# Patient Record
Sex: Female | Born: 1955 | Race: White | Hispanic: No | Smoking: Current every day smoker
Health system: Southern US, Community
[De-identification: ages and names within clinical notes are randomized; demographics above are authoritative.]

## PROBLEM LIST (undated history)

## (undated) DIAGNOSIS — C539 Malignant neoplasm of cervix uteri, unspecified: Secondary | ICD-10-CM

## (undated) HISTORY — PX: NO PAST SURGERIES: SHX2092

---

## 1986-05-21 DIAGNOSIS — C539 Malignant neoplasm of cervix uteri, unspecified: Secondary | ICD-10-CM

## 1986-05-21 HISTORY — DX: Malignant neoplasm of cervix uteri, unspecified: C53.9

## 2004-07-20 ENCOUNTER — Ambulatory Visit: Payer: Self-pay | Admitting: Unknown Physician Specialty

## 2005-10-03 ENCOUNTER — Emergency Department: Payer: Self-pay | Admitting: Emergency Medicine

## 2008-02-12 ENCOUNTER — Emergency Department: Payer: Self-pay | Admitting: Emergency Medicine

## 2008-04-17 ENCOUNTER — Emergency Department: Payer: Self-pay | Admitting: Emergency Medicine

## 2010-06-07 ENCOUNTER — Ambulatory Visit: Payer: Self-pay

## 2014-05-16 ENCOUNTER — Emergency Department: Payer: Self-pay | Admitting: Emergency Medicine

## 2016-04-08 ENCOUNTER — Emergency Department: Payer: Self-pay

## 2016-04-08 ENCOUNTER — Emergency Department
Admission: EM | Admit: 2016-04-08 | Discharge: 2016-04-08 | Disposition: A | Discharge status: Home / Self Care | Payer: Self-pay | Attending: Emergency Medicine | Admitting: Emergency Medicine

## 2016-04-08 ENCOUNTER — Encounter: Payer: Self-pay | Admitting: Emergency Medicine

## 2016-04-08 DIAGNOSIS — B9789 Other viral agents as the cause of diseases classified elsewhere: Secondary | ICD-10-CM

## 2016-04-08 DIAGNOSIS — B9689 Other specified bacterial agents as the cause of diseases classified elsewhere: Secondary | ICD-10-CM

## 2016-04-08 DIAGNOSIS — J018 Other acute sinusitis: Secondary | ICD-10-CM | POA: Insufficient documentation

## 2016-04-08 DIAGNOSIS — Z8541 Personal history of malignant neoplasm of cervix uteri: Secondary | ICD-10-CM | POA: Insufficient documentation

## 2016-04-08 DIAGNOSIS — H109 Unspecified conjunctivitis: Secondary | ICD-10-CM | POA: Insufficient documentation

## 2016-04-08 DIAGNOSIS — J069 Acute upper respiratory infection, unspecified: Secondary | ICD-10-CM | POA: Insufficient documentation

## 2016-04-08 DIAGNOSIS — J019 Acute sinusitis, unspecified: Secondary | ICD-10-CM

## 2016-04-08 DIAGNOSIS — F17210 Nicotine dependence, cigarettes, uncomplicated: Secondary | ICD-10-CM | POA: Insufficient documentation

## 2016-04-08 HISTORY — DX: Malignant neoplasm of cervix uteri, unspecified: C53.9

## 2016-04-08 LAB — INFLUENZA PANEL BY PCR (TYPE A & B)
INFLBPCR: NEGATIVE
Influenza A By PCR: NEGATIVE

## 2016-04-08 MED ORDER — BENZONATATE 100 MG PO CAPS: 100.0000 mg | ORAL_CAPSULE | Freq: Three times a day (TID) | ORAL | 0 refills | Status: DC | PRN

## 2016-04-08 MED ORDER — HYDROCODONE-ACETAMINOPHEN 5-325 MG PO TABS: 1.0000 | ORAL_TABLET | ORAL | 0 refills | Status: DC | PRN

## 2016-04-08 MED ORDER — POLYMYXIN B-TRIMETHOPRIM 10000-0.1 UNIT/ML-% OP SOLN: 1.0000 [drp] | Freq: Four times a day (QID) | OPHTHALMIC | 0 refills | Status: DC

## 2016-04-08 MED ORDER — AMOXICILLIN-POT CLAVULANATE 875-125 MG PO TABS: 1.0000 | ORAL_TABLET | Freq: Two times a day (BID) | ORAL | 0 refills | Status: AC

## 2016-04-08 NOTE — ED Provider Notes (Addendum)
[facility] Emergency Department Provider Note  ____________________________________________   None    (approximate)  I have reviewed the triage vital signs and the nursing notes.   HISTORY  Chief Complaint Headache; Nasal Congestion; Generalized Body Aches; Cough; and Eye Drainage    HPI Roberta Cruz is a 60 y.o. female is no chronic medical history except for daily tobacco use who presents for about a week of viral/flulike symptoms including congestion, cough, body aches, subjective fever, bilateral earaches.  She is been gradually getting worse and over the last couple of days she now has copious green and yellow and bloody nasal discharge particularly from her left nostril.  She has sinus tenderness in the left side of her face that is radiating into her left upper jaw and up to her left eye.  She has developed some redness of her left eye today and it feels itchy and uncomfortable.  Overall she states her symptoms are severe and nothing is making them better or worse.  She has tried "every over-the-counter thing I could find" but nothing makes her feel any better.  She is having some pain when she coughs and she feels some wheezing and shortness of breath.  She denies abdominal pain, nausea, vomiting, dysuria.   Past Medical History:  Diagnosis Date  . Cervical cancer ([place])     There are no active problems to display for this patient.   History reviewed. No pertinent surgical history.  Prior to Admission medications   Medication Sig Start Date End Date Taking? Authorizing Provider  amoxicillin-clavulanate (AUGMENTIN) 875-125 MG tablet Take 1 tablet by mouth every 12 (twelve) hours. 04/08/16 04/18/16  Hinda Kehr, MD  benzonatate (TESSALON PERLES) 100 MG capsule Take 1 capsule (100 mg total) by mouth 3 (three) times daily as needed for cough. 04/08/16   Hinda Kehr, MD  HYDROcodone-acetaminophen (NORCO/VICODIN) 5-325 MG tablet Take 1-2 tablets by  mouth every 4 (four) hours as needed for moderate pain. 04/08/16   Hinda Kehr, MD  trimethoprim-polymyxin b (POLYTRIM) ophthalmic solution Place 1 drop into the left eye every 6 (six) hours. Continue treatment for one week even if symptoms improve. 04/08/16   Hinda Kehr, MD    Allergies Codeine  History reviewed. No pertinent family history.  Social History Social History  Substance Use Topics  . Smoking status: Current Every Day Smoker    Packs/day: 0.50    Types: Cigarettes  . Smokeless tobacco: Never Used  . Alcohol use No    Review of Systems Constitutional: Subjective fever/chills Eyes: No visual changes. ENT: Mild sore throat.  Copious greenish-yellow and bloody nasal discharge worse on the left with left maxillary sinus pain Cardiovascular: Denies chest pain. Respiratory: +shortness of breath and wheezing with productive cough Gastrointestinal: No abdominal pain.  No nausea, no vomiting.  No diarrhea.  No constipation. Genitourinary: Negative for dysuria. Musculoskeletal: Negative for back pain. Skin: Negative for rash. Neurological: Negative for headaches, focal weakness or numbness.  10-point ROS otherwise negative.  ____________________________________________   PHYSICAL EXAM:  VITAL SIGNS: ED Triage Vitals  Enc Vitals Group     BP 04/08/16 0514 117/87     Pulse Rate 04/08/16 0514 95     Resp --      Temp 04/08/16 0514 99.1 F (37.3 C)     Temp Source 04/08/16 0514 Oral     SpO2 04/08/16 0514 96 %     Weight 04/08/16 0515 150 lb (68 kg)     Height 04/08/16  0515 5' 4.5" (1.638 m)     Head Circumference --      Peak Flow --      Pain Score 04/08/16 0515 8     Pain Loc --      Pain Edu? --      Excl. in [place]? --     Constitutional: Alert and oriented. Ill and uncomfortable appearing but non-toxic. No acute respiratory distress Eyes: injected conjunctiva on the left. PERRL. EOMI. Head: Atraumatic. Ears:  Erythematous ear canals bilaterally but  normal appearing TMs  Nose: Sever congestion/rhinnorhea with maxillary sinus tenderness to palpation on the left Mouth/Throat: Mucous membranes are moist.  Oropharynx non-erythematous. Neck: No stridor.  No meningeal signs.   Cardiovascular: Normal rate, regular rhythm. Good peripheral circulation. Grossly normal heart sounds. Respiratory: Normal respiratory effort.  No retractions. Lungs CTAB. Frequent productive cough Gastrointestinal: Soft and nontender. No distention.  Musculoskeletal: No lower extremity tenderness nor edema. No gross deformities of extremities. Neurologic:  Normal speech and language. No gross focal neurologic deficits are appreciated.  Skin:  Skin is warm, dry and intact. No rash noted. Psychiatric: Mood and affect are normal. Speech and behavior are normal.  ____________________________________________   LABS (all labs ordered are listed, but only abnormal results are displayed)  Labs Reviewed  INFLUENZA PANEL BY PCR (TYPE A & B, H1N1)   ____________________________________________  EKG  None - EKG not ordered by ED physician ____________________________________________  RADIOLOGY   Dg Chest 2 View  Result Date: 04/08/2016 CLINICAL DATA:  Productive cough and fever EXAM: CHEST  2 VIEW COMPARISON:  None. FINDINGS: Normal heart size and mediastinal contours. No acute infiltrate or edema. No effusion or pneumothorax. No acute osseous findings. IMPRESSION: Negative for pneumonia. Electronically Signed   By: Monte Fantasia M.D.   On: 04/08/2016 07:01    ____________________________________________   PROCEDURES  Procedure(s) performed:   Procedures   Critical Care performed: No ____________________________________________   INITIAL IMPRESSION / ASSESSMENT AND PLAN / ED COURSE  Pertinent labs & imaging results that were available during my care of the patient were reviewed by me and considered in my medical decision making (see chart for  details).  I believe that the patient's symptoms likely started as a viral URI but may have developed into a bacterial infection, either pneumonia, bacterial rhinosinusitis, or both.  I will check a two-view chest x-ray and influenza is pending.  I anticipate treating empirically for rhinosinusitis with Augmentin although I may change to Levaquin based on the results of her chest x-ray.  I explained this to the patient and she understands and agrees with the plan.   Clinical Course as of Apr 08 736  Nancy Fetter Apr 08, 2016  [id number] Influenza A By PCR: NEGATIVE [CF]  0659 Influenza B By PCR: NEGATIVE [CF]  0716 DG Chest 2 View [CF]  0716 No evidence of pneumonia on chest x-ray.  I will treat the patient has a described above for presumed bacterial rhinosinusitis.  I told the patient to not expect that she is going to feel completely better within several days and that she will likely require a week or more to return to even close to her baseline given that I think that she most likely has a viral infection as the initial cause of her illness.  I gave my usual and customary management recommendations and return precautions and she understands and agrees with plan.  [CF]    Clinical Course User Index [CF] Hinda Kehr, MD  ____________________________________________  FINAL CLINICAL IMPRESSION(S) / ED DIAGNOSES  Final diagnoses:  Acute bacterial rhinosinusitis  Viral URI with cough  Conjunctivitis of left eye, unspecified conjunctivitis type     MEDICATIONS GIVEN DURING THIS VISIT:  Medications - No data to display   NEW OUTPATIENT MEDICATIONS STARTED DURING THIS VISIT:  New Prescriptions   AMOXICILLIN-CLAVULANATE (AUGMENTIN) 875-125 MG TABLET    Take 1 tablet by mouth every 12 (twelve) hours.   BENZONATATE (TESSALON PERLES) 100 MG CAPSULE    Take 1 capsule (100 mg total) by mouth 3 (three) times daily as needed for cough.   HYDROCODONE-ACETAMINOPHEN (NORCO/VICODIN) 5-325 MG TABLET     Take 1-2 tablets by mouth every 4 (four) hours as needed for moderate pain.   TRIMETHOPRIM-POLYMYXIN B (POLYTRIM) OPHTHALMIC SOLUTION    Place 1 drop into the left eye every 6 (six) hours. Continue treatment for one week even if symptoms improve.    Modified Medications   No medications on file    Discontinued Medications   No medications on file     Note:  This document was prepared using Dragon voice recognition software and may include unintentional dictation errors.    Hinda Kehr, MD 04/08/16 [id number]    Hinda Kehr, MD 04/08/16 [id number]

## 2016-04-08 NOTE — ED Triage Notes (Addendum)
Pt reports one week of flu-like symptoms-sinus congestion, bilateral earaches, productive cough of yellow and green sputum, left eye drainage; pt says when she blows her nose she has green and yellow drainage that is sometimes blood-tinged; pt says she thinks she had a fever a few days ago; afebrile currently

## 2016-05-18 ENCOUNTER — Emergency Department
Admission: EM | Admit: 2016-05-18 | Discharge: 2016-05-18 | Disposition: A | Discharge status: Home / Self Care | Payer: Self-pay | Attending: Emergency Medicine | Admitting: Emergency Medicine

## 2016-05-18 ENCOUNTER — Encounter: Payer: Self-pay | Admitting: Emergency Medicine

## 2016-05-18 DIAGNOSIS — F17210 Nicotine dependence, cigarettes, uncomplicated: Secondary | ICD-10-CM | POA: Insufficient documentation

## 2016-05-18 DIAGNOSIS — J40 Bronchitis, not specified as acute or chronic: Secondary | ICD-10-CM | POA: Insufficient documentation

## 2016-05-18 DIAGNOSIS — Z8541 Personal history of malignant neoplasm of cervix uteri: Secondary | ICD-10-CM | POA: Insufficient documentation

## 2016-05-18 MED ORDER — AZITHROMYCIN 250 MG PO TABS: ORAL_TABLET | ORAL | 0 refills | Status: AC

## 2016-05-18 MED ORDER — IPRATROPIUM-ALBUTEROL 0.5-2.5 (3) MG/3ML IN SOLN
3.0000 mL | Freq: Once | RESPIRATORY_TRACT | Status: AC
  Administered 2016-05-18: 3 mL via RESPIRATORY_TRACT
  Filled 2016-05-18: qty 3

## 2016-05-18 NOTE — ED Triage Notes (Addendum)
Pt states she has been having cough, congestion, ear pain and facial pain since Monday night. Pt taking OTC medications without relief and has been using Afrin as well.  Pt reports she has had problems with her sinuses about a month ago and was treated with Amoxicillin. Pt states she is not able to breathe throughout her nose.

## 2016-05-18 NOTE — ED Notes (Signed)
Pt is in good condition; discharge instructions reviewed; follow up care and home care reviewed; prescription medication reviewed; pt verbalized understanding; pt is ambulatory and left ED by herself

## 2016-05-18 NOTE — ED Provider Notes (Signed)
[facility] Emergency Department Provider Note  ____________________________________________  Time seen: Approximately 6:48 PM  I have reviewed the triage vital signs and the nursing notes.   HISTORY  Chief Complaint Nasal Congestion; Cough; Facial Pain; and Otalgia    HPI Roberta Cruz is a 60 y.o. female presenting to the emergency department with 2 days of acute productive cough. Patient states that she is coughing up brown and green chunks. Patient states that she typically has several episodes of bronchitis every year. She currently smokes one half pack of cigarettes per day. Additional symptoms include congestion, ear pain and maxillary sinus tenderness that have occurred since Monday. Patient states that she has attempted Afrin for congestion. Patient states that she was treated one month ago for sinusitis with Augmentin. Patient states that her symptoms improved but never resolved. She denies fever, chest pain, shortness of breath, nausea, and vomiting.    Past Medical History:  Diagnosis Date  . Cervical cancer ([place])     There are no active problems to display for this patient.   History reviewed. No pertinent surgical history.  Prior to Admission medications   Medication Sig Start Date End Date Taking? Authorizing Provider  azithromycin (ZITHROMAX Z-PAK) 250 MG tablet Take 2 tablets (500 mg) on  Day 1,  followed by 1 tablet (250 mg) once daily on Days 2 through 5. 05/18/16 05/23/16  Lannie Fields, PA-C  benzonatate (TESSALON PERLES) 100 MG capsule Take 1 capsule (100 mg total) by mouth 3 (three) times daily as needed for cough. 04/08/16   Hinda Kehr, MD  HYDROcodone-acetaminophen (NORCO/VICODIN) 5-325 MG tablet Take 1-2 tablets by mouth every 4 (four) hours as needed for moderate pain. 04/08/16   Hinda Kehr, MD  trimethoprim-polymyxin b (POLYTRIM) ophthalmic solution Place 1 drop into the left eye every 6 (six) hours. Continue treatment for one  week even if symptoms improve. 04/08/16   Hinda Kehr, MD    Allergies Codeine  History reviewed. No pertinent family history.  Social History Social History  Substance Use Topics  . Smoking status: Current Every Day Smoker    Packs/day: 0.50    Types: Cigarettes  . Smokeless tobacco: Never Used  . Alcohol use No     Review of Systems  Constitutional: No fever/chills Eyes: No visual changes. No discharge ENT: Has productive cough, congestion Cardiovascular: no chest pain. Respiratory: No SOB. Gastrointestinal: No abdominal pain.  No nausea, no vomiting.  No diarrhea.  No constipation. Genitourinary: Negative for dysuria. No hematuria Musculoskeletal: Negative for musculoskeletal pain. Skin: Negative for rash, abrasions, lacerations, ecchymosis. Neurological: Negative for headaches, focal weakness or numbness. 10-point ROS otherwise negative.  ____________________________________________   PHYSICAL EXAM:  VITAL SIGNS: ED Triage Vitals [05/18/16 1825]  Enc Vitals Group     BP (!) 119/98     Pulse Rate 88     Resp 18     Temp 98.1 F (36.7 C)     Temp Source Oral     SpO2 97 %     Weight 145 lb (65.8 kg)     Height 5\' 4"  (1.626 m)     Head Circumference      Peak Flow      Pain Score 6     Pain Loc      Pain Edu?      Excl. in [place]?      Constitutional: Alert and oriented. Well appearing and in no acute distress. Eyes: Conjunctivae are normal. PERRL. EOMI. Head: Atraumatic.  ENT:      Ears: Tympanic membranes are pearly bilaterally without erythema or purulent exudate. Bony landmarks are visualized bilaterally.      Nose: Nasal turbinates are edematous.      Mouth/Throat: Mucous membranes are moist. Posterior pharynx is without erythema or tonsillar exudate. Neck:FROM. No pain is elicited with neck flexion. Hematological/Lymphatic/Immunilogical: No cervical lymphadenopathy. Cardiovascular: Normal rate, regular rhythm. Normal S1 and S2.  Good peripheral  circulation. Respiratory: Normal respiratory effort without tachypnea or retractions. Patient has diffuse wheezing bilaterally. Wheezing improved to auscultation after DuoNeb treatment. Skin:  Skin is warm, dry and intact. No rash noted. Psychiatric: Mood and affect are normal. Speech and behavior are normal. Patient exhibits appropriate insight and judgement.   ____________________________________________   LABS (all labs ordered are listed, but only abnormal results are displayed)  Labs Reviewed - No data to display ____________________________________________  EKG   ____________________________________________  RADIOLOGY  No results found.  ____________________________________________    PROCEDURES  Procedure(s) performed:    Procedures    Medications  ipratropium-albuterol (DUONEB) 0.5-2.5 (3) MG/3ML nebulizer solution 3 mL (not administered)     ____________________________________________   INITIAL IMPRESSION / ASSESSMENT AND PLAN / ED COURSE  Pertinent labs & imaging results that were available during my care of the patient were reviewed by me and considered in my medical decision making (see chart for details).  Review of the [place] CSRS was performed in accordance of the [place] prior to dispensing any controlled drugs.  Clinical Course    Assessment and Plan Acute bronchitis:  Patient typically has multiple episodes of acute bronchitis yearly. Patient has cough productive for purulent sputum production. Patient does not wish to undergo chest x-ray examination at this time. I consented to request as my plan of care does not change with chest x-ray. Patient was discharged with azithromycin. Wheezing auscultated during initial physical exam improved after DuoNeb treatment. Vital signs are reassuring at this time. All patient questions were answered. ____________________________________________  FINAL CLINICAL IMPRESSION(S) / ED DIAGNOSES  Final diagnoses:   Bronchitis      NEW MEDICATIONS STARTED DURING THIS VISIT:  New Prescriptions   AZITHROMYCIN (ZITHROMAX Z-PAK) 250 MG TABLET    Take 2 tablets (500 mg) on  Day 1,  followed by 1 tablet (250 mg) once daily on Days 2 through 5.        This chart was dictated using voice recognition software/Dragon. Despite best efforts to proofread, errors can occur which can change the meaning. Any change was purely unintentional.    Lannie Fields, PA-C 05/18/16 1906    Lavonia Drafts, MD 05/20/16 [phone]

## 2017-01-04 ENCOUNTER — Emergency Department
Admission: EM | Admit: 2017-01-04 | Discharge: 2017-01-04 | Disposition: A | Discharge status: Home / Self Care | Payer: No Typology Code available for payment source | Attending: Emergency Medicine | Admitting: Emergency Medicine

## 2017-01-04 ENCOUNTER — Emergency Department: Payer: No Typology Code available for payment source

## 2017-01-04 ENCOUNTER — Encounter: Payer: Self-pay | Admitting: Emergency Medicine

## 2017-01-04 DIAGNOSIS — Y9389 Activity, other specified: Secondary | ICD-10-CM | POA: Diagnosis not present

## 2017-01-04 DIAGNOSIS — S060X0A Concussion without loss of consciousness, initial encounter: Secondary | ICD-10-CM | POA: Insufficient documentation

## 2017-01-04 DIAGNOSIS — Y999 Unspecified external cause status: Secondary | ICD-10-CM | POA: Insufficient documentation

## 2017-01-04 DIAGNOSIS — F17210 Nicotine dependence, cigarettes, uncomplicated: Secondary | ICD-10-CM | POA: Insufficient documentation

## 2017-01-04 DIAGNOSIS — Y92410 Unspecified street and highway as the place of occurrence of the external cause: Secondary | ICD-10-CM | POA: Insufficient documentation

## 2017-01-04 DIAGNOSIS — S0990XA Unspecified injury of head, initial encounter: Secondary | ICD-10-CM | POA: Diagnosis present

## 2017-01-04 MED ORDER — KETOROLAC TROMETHAMINE 30 MG/ML IJ SOLN
15.0000 mg | Freq: Once | INTRAMUSCULAR | Status: AC
  Administered 2017-01-04: 15 mg via INTRAVENOUS
  Filled 2017-01-04: qty 1

## 2017-01-04 MED ORDER — ONDANSETRON HCL 4 MG/2ML IJ SOLN
4.0000 mg | Freq: Once | INTRAMUSCULAR | Status: AC
  Administered 2017-01-04: 4 mg via INTRAVENOUS
  Filled 2017-01-04: qty 2

## 2017-01-04 MED ORDER — SODIUM CHLORIDE 0.9 % IV BOLUS (SEPSIS)
1000.0000 mL | Freq: Once | INTRAVENOUS | Status: AC
  Administered 2017-01-04: 1000 mL via INTRAVENOUS

## 2017-01-04 MED ORDER — PROMETHAZINE HCL 25 MG PO TABS: 25.0000 mg | ORAL_TABLET | Freq: Four times a day (QID) | ORAL | 0 refills | Status: AC | PRN

## 2017-01-04 NOTE — ED Notes (Signed)
Patient transported to CT 
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
## 2017-01-04 NOTE — ED Notes (Signed)
Pt now also reports was sweating at work and had episode of vomiting.

## 2017-01-04 NOTE — ED Notes (Signed)
Assisted to bathroom. Steady gait while walking.

## 2017-01-04 NOTE — ED Notes (Signed)
Patient called out, needed to void.  Capturing urine in urinal hat for lab.

## 2017-01-09 ENCOUNTER — Ambulatory Visit (INDEPENDENT_AMBULATORY_CARE_PROVIDER_SITE_OTHER): Payer: Self-pay | Admitting: Family Medicine

## 2017-01-09 ENCOUNTER — Encounter: Payer: Self-pay | Admitting: Family Medicine

## 2017-01-09 VITALS — BP 104/70 | HR 60 | Temp 97.9°F | Resp 16 | Ht 64.5 in | Wt 143.0 lb

## 2017-01-09 DIAGNOSIS — M545 Low back pain, unspecified: Secondary | ICD-10-CM

## 2017-01-09 DIAGNOSIS — S060XAA Concussion with loss of consciousness status unknown, initial encounter: Secondary | ICD-10-CM | POA: Insufficient documentation

## 2017-01-09 DIAGNOSIS — S060X9A Concussion with loss of consciousness of unspecified duration, initial encounter: Secondary | ICD-10-CM | POA: Insufficient documentation

## 2017-01-09 DIAGNOSIS — Z720 Tobacco use: Secondary | ICD-10-CM

## 2017-01-09 DIAGNOSIS — Z8541 Personal history of malignant neoplasm of cervix uteri: Secondary | ICD-10-CM

## 2017-01-09 DIAGNOSIS — M549 Dorsalgia, unspecified: Secondary | ICD-10-CM | POA: Insufficient documentation

## 2017-01-09 DIAGNOSIS — S060X0A Concussion without loss of consciousness, initial encounter: Secondary | ICD-10-CM

## 2017-01-09 MED ORDER — CYCLOBENZAPRINE HCL 5 MG PO TABS: 5.0000 mg | ORAL_TABLET | Freq: Every evening | ORAL | 0 refills | Status: DC | PRN
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
## 2017-01-09 NOTE — Assessment & Plan Note (Signed)
Counseled on tobacco cessation Patient is working on cutting back on smoking, but she is doing more vaping in place of smoking
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
## 2017-01-09 NOTE — Assessment & Plan Note (Signed)
Patient reports history of cervical cancer in the late 1980s, for which she was treated at [facility] with laser therapy She does still have her cervix She has not received a Pap smear in many years Counseled about importance of regular Pap smears Patient to make follow-up CPE appointment

## 2017-01-09 NOTE — Patient Instructions (Addendum)
Low Back Sprain A sprain is a stretch or tear in the bands of tissue that hold bones and joints together (ligaments). Sprains of the lower back (lumbar spine) are a common cause of low back pain. A sprain occurs when ligaments are overextended or stretched beyond their limits. The ligaments can become inflamed, resulting in pain and sudden muscle tightening (spasms). A sprain can be caused by an injury (trauma), or it can develop gradually due to overuse. There are three types of sprains:  Grade 1 is a mild sprain involving an overstretched ligament or a very slight tear of the ligament.  Grade 2 is a moderate sprain involving a partial tear of the ligament.  Grade 3 is a severe sprain involving a complete tear of the ligament.  What are the causes? This condition may be caused by:  Trauma, such as a fall or a hit to the body.  Twisting or overstretching the back. This may result from doing activities that require a lot of energy, such as lifting heavy objects.  What increases the risk? The following factors may increase your risk of getting this condition:  Playing contact sports.  Participating in sports or activities that put excessive stress on the back and require a lot of bending and twisting, including: ? Lifting weights or heavy objects. ? Gymnastics. ? Soccer. ? Figure skating. ? Snowboarding.  Being overweight or obese.  Having poor strength and flexibility.  What are the signs or symptoms? Symptoms of this condition may include:  Sharp or dull pain in the lower back that does not go away. Pain may extend to the buttocks.  Stiffness.  Limited range of motion.  Inability to stand up straight due to stiffness or pain.  Muscle spasms.  How is this diagnosed?  This condition may be diagnosed based on:  Your symptoms.  Your medical history.  A physical exam. ? Your health care provider may push on certain areas of your back to determine the source of your  pain. ? You may be asked to bend forward, backward, and side to side to assess the severity of your pain and your range of motion.  Imaging tests, such as: ? X-rays. ? MRI.  How is this treated? Treatment for this condition may include:  Applying heat and cold to the affected area.  Medicines to help relieve pain and to relax your muscles (muscle relaxants).  NSAIDs to help reduce swelling and discomfort.  Physical therapy.  When your symptoms improve, it is important to gradually return to your normal routine as soon as possible to reduce pain, avoid stiffness, and avoid loss of muscle strength. Generally, symptoms should improve within 6 weeks of treatment. However, recovery time varies. Follow these instructions at home: Managing pain, stiffness, and swelling  If directed, apply ice to the injured area during the first 24 hours after your injury. ? Put ice in a plastic bag. ? Place a towel between your skin and the bag. ? Leave the ice on for 20 minutes, 2-3 times a day.  If directed, apply heat to the affected area as often as told by your health care provider. Use the heat source that your health care provider recommends, such as a moist heat pack or a heating pad. ? Place a towel between your skin and the heat source. ? Leave the heat on for 20-30 minutes. ? Remove the heat if your skin turns bright red. This is especially important if you are unable to feel pain,  heat, or cold. You may have a greater risk of getting burned. Activity  Rest and return to your normal activities as told by your health care provider. Ask your health care provider what activities are safe for you.  Avoid activities that take a lot of effort (are strenuous) for as long as told by your health care provider.  Do exercises as told by your health care provider. General instructions   Take over-the-counter and prescription medicines only as told by your health care provider.  If you have  questions or concerns about safety while taking pain medicine, talk with your health care provider.  Do not drive or operate heavy machinery until you know how your pain medicine affects you.  Do not use any tobacco products, such as cigarettes, chewing tobacco, and e-cigarettes. Tobacco can delay bone healing. If you need help quitting, ask your health care provider.  Keep all follow-up visits as told by your health care provider. This is important. How is this prevented?  Warm up and stretch before being active.  Cool down and stretch after being active.  Give your body time to rest between periods of activity.  Avoid: ? Being physically inactive for long periods at a time. ? Exercising or playing sports when you are tired or in pain.  Use correct form when playing sports and lifting heavy objects.  Use good posture when sitting and standing.  Maintain a healthy weight.  Sleep on a mattress with medium firmness to support your back.  Make sure to use equipment that fits you, including shoes that fit well.  Be safe and responsible while being active to avoid falls.  Do at least 150 minutes of moderate-intensity exercise each week, such as brisk walking or water aerobics. Try a form of exercise that takes stress off your back, such as swimming or stationary cycling.  Maintain physical fitness, including: ? Strength. In particular, develop and maintain strong abdominal muscles. ? Flexibility. ? Cardiovascular fitness. ? Endurance. Contact a health care provider if:  Your back pain does not improve after 6 weeks of treatment.  Your symptoms get worse. Get help right away if:  Your back pain is severe.  You are unable to stand or walk.  You develop pain in your legs.  You develop weakness in your buttocks or legs.  You have difficulty controlling when you urinate or when you have a bowel movement. This information is not intended to replace advice given to you by  your health care provider. Make sure you discuss any questions you have with your health care provider. Document Released: 05/07/2005 Document Revised: 01/12/2016 Document Reviewed: 02/16/2015 Elsevier Interactive Patient Education  2018 [place] could try a massage.

## 2017-01-10 ENCOUNTER — Telehealth: Payer: Self-pay

## 2017-01-10 ENCOUNTER — Other Ambulatory Visit: Payer: Self-pay | Admitting: Family Medicine

## 2017-01-10 DIAGNOSIS — Z599 Problem related to housing and economic circumstances, unspecified: Secondary | ICD-10-CM

## 2017-01-10 DIAGNOSIS — Z598 Other problems related to housing and economic circumstances: Secondary | ICD-10-CM
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
## 2017-01-15 NOTE — Telephone Encounter (Signed)
Roberta Cruz did you call patient regarding a referral last week?

## 2017-02-18 ENCOUNTER — Telehealth: Payer: Self-pay

## 2017-02-18 NOTE — Telephone Encounter (Signed)
Lmtcb to schedule CPE, pt needs pap and colonoscopy.

## 2017-05-28 ENCOUNTER — Telehealth: Payer: Self-pay | Admitting: Family Medicine

## 2017-05-28 NOTE — Telephone Encounter (Signed)
Patient states that she need a note stating that she needed to be out of work as needed   CB# [phone]
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
## 2017-05-29 NOTE — Telephone Encounter (Signed)
Can you check with patient to see what she means by this? I can give a work note for the visit I had with her and the ER visit prior to that.  I can not give a vague work note to be out "as needed" whenever.  Virginia Crews, MD, MPH [facility] 05/29/2017 8:55 AM

## 2017-05-30 ENCOUNTER — Encounter: Payer: Self-pay | Admitting: Family Medicine

## 2017-05-30 NOTE — Telephone Encounter (Signed)
Pt advised and letter placed at reception for pick up.

## 2017-05-30 NOTE — Telephone Encounter (Signed)
Letter completed and printed.  Please let patient know that she can pick this up at her convenience.  Virginia Crews, MD, MPH [facility] 05/30/2017 9:23 AM

## 2019-06-25 ENCOUNTER — Encounter: Payer: Self-pay | Admitting: Family Medicine

## 2019-06-25 ENCOUNTER — Telehealth: Payer: Self-pay | Admitting: Family Medicine

## 2019-06-25 ENCOUNTER — Ambulatory Visit (INDEPENDENT_AMBULATORY_CARE_PROVIDER_SITE_OTHER): Payer: Self-pay | Admitting: Family Medicine

## 2019-06-25 ENCOUNTER — Ambulatory Visit
Admission: RE | Admit: 2019-06-25 | Discharge: 2019-06-25 | Disposition: A | Discharge status: Home / Self Care | Payer: No Typology Code available for payment source | Source: Ambulatory Visit | Attending: Family Medicine | Admitting: Family Medicine

## 2019-06-25 ENCOUNTER — Other Ambulatory Visit: Payer: Self-pay

## 2019-06-25 VITALS — BP 116/78 | HR 72 | Temp 96.8°F | Wt 153.8 lb

## 2019-06-25 DIAGNOSIS — M62838 Other muscle spasm: Secondary | ICD-10-CM | POA: Diagnosis not present

## 2019-06-25 DIAGNOSIS — S134XXA Sprain of ligaments of cervical spine, initial encounter: Secondary | ICD-10-CM | POA: Diagnosis present

## 2019-06-25 DIAGNOSIS — M542 Cervicalgia: Secondary | ICD-10-CM | POA: Diagnosis present

## 2019-06-25 DIAGNOSIS — M62830 Muscle spasm of back: Secondary | ICD-10-CM | POA: Diagnosis not present

## 2019-06-25 MED ORDER — CYCLOBENZAPRINE HCL 10 MG PO TABS: 10.0000 mg | ORAL_TABLET | Freq: Three times a day (TID) | ORAL | 0 refills | Status: AC | PRN

## 2019-06-25 MED ORDER — MELOXICAM 15 MG PO TABS: 15.0000 mg | ORAL_TABLET | Freq: Every day | ORAL | 0 refills | Status: DC

## 2019-06-25 NOTE — Telephone Encounter (Signed)
Patient was instructed to give the following claim number at her time of visit:  [id number] [employer]

## 2019-06-25 NOTE — Patient Instructions (Signed)
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
## 2019-06-26 ENCOUNTER — Ambulatory Visit: Payer: Self-pay | Admitting: *Deleted

## 2019-06-26 ENCOUNTER — Encounter: Payer: Self-pay | Admitting: Family Medicine

## 2019-06-26 ENCOUNTER — Ambulatory Visit (INDEPENDENT_AMBULATORY_CARE_PROVIDER_SITE_OTHER): Payer: Self-pay | Admitting: Family Medicine

## 2019-06-26 ENCOUNTER — Telehealth: Payer: Self-pay

## 2019-06-26 DIAGNOSIS — R252 Cramp and spasm: Secondary | ICD-10-CM

## 2019-06-26 NOTE — Telephone Encounter (Signed)
-----   Message from Virginia Crews, MD sent at 06/26/2019 10:18 AM EST ----- No fractures or acute problems.  Mild arthritic changes
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
## 2019-06-26 NOTE — Telephone Encounter (Signed)
Please advise 

## 2019-06-26 NOTE — Telephone Encounter (Signed)
LMTCB 06/26/2019.  PEC please give pt's results below.    Thanks,   -Mickel Baas
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
## 2019-06-26 NOTE — Telephone Encounter (Signed)
Pt advised of results.  Pt would still like Mickel Baas to call her back regarding pain in the back of her legs. CB# [phone]

## 2019-06-26 NOTE — Telephone Encounter (Signed)
Should I offer the 6:20pm

## 2019-06-26 NOTE — Telephone Encounter (Signed)
-----   Message from Virginia Crews, MD sent at 06/26/2019 10:18 AM EST ----- No fractures.  Arthritic changes

## 2019-06-26 NOTE — Telephone Encounter (Signed)
Telephone visit 3:40 today  Thanks,   -Mickel Baas

## 2019-06-26 NOTE — Telephone Encounter (Signed)
Nevermind Dr.B did an office visit already with her today.

## 2019-06-29 ENCOUNTER — Telehealth: Payer: Self-pay

## 2019-06-29 DIAGNOSIS — M62838 Other muscle spasm: Secondary | ICD-10-CM

## 2019-06-29 DIAGNOSIS — M62830 Muscle spasm of back: Secondary | ICD-10-CM

## 2019-06-29 DIAGNOSIS — S134XXA Sprain of ligaments of cervical spine, initial encounter: Secondary | ICD-10-CM

## 2019-06-29 DIAGNOSIS — M542 Cervicalgia: Secondary | ICD-10-CM

## 2019-06-29 MED ORDER — TRAMADOL HCL 50 MG PO TABS: 50.0000 mg | ORAL_TABLET | Freq: Three times a day (TID) | ORAL | 0 refills | Status: AC | PRN

## 2019-06-29 NOTE — Telephone Encounter (Signed)
Copied from [place] [phone]. Topic: General - Other >> Jun 29, 2019  2:04 PM Leward Quan A wrote: Reason for CRM: Patient called to ask Dr Brita Romp to please give her a call in reference to her visit from last week. Please call her at Ph# [phone]
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
## 2019-06-29 NOTE — Telephone Encounter (Signed)
Patient advised as below.  

## 2019-06-29 NOTE — Telephone Encounter (Signed)
We will send in tramadol to use as needed for breakthrough pain.  It is okay to take it with the meloxicam and Flexeril.  We will also refer to Ortho for further evaluation and management.

## 2019-06-29 NOTE — Addendum Note (Signed)
Addended by: Virginia Crews on: 06/29/2019 04:23 PM   Modules accepted: Orders

## 2019-07-02 ENCOUNTER — Telehealth: Payer: Self-pay

## 2019-07-02 NOTE — Telephone Encounter (Signed)
Patient reports back pain is not getting any better. Patient wants to know if a massage or PT will help. Please advise.

## 2019-07-02 NOTE — Telephone Encounter (Signed)
Copied from [place] [phone]. Topic: Referral - Request for Referral >> Jul 02, 2019  3:48 PM Sheran Luz wrote: Patient requesting call back from office staff to discuss a potential referral for physical therapy. Patient states she has spoken with PCP about this previously. Please advise.

## 2019-07-03 ENCOUNTER — Encounter: Payer: Self-pay | Admitting: Family Medicine
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
## 2019-07-03 NOTE — Telephone Encounter (Signed)
I believe that we placed a referral to orthopedics for this last week.  I do think that massage could be helpful

## 2019-07-03 NOTE — Telephone Encounter (Signed)
Letter printed. Not sure we can email it to her though.Marland KitchenMarland Kitchen

## 2019-07-16 ENCOUNTER — Telehealth: Payer: Self-pay

## 2019-07-16 DIAGNOSIS — S134XXD Sprain of ligaments of cervical spine, subsequent encounter: Secondary | ICD-10-CM

## 2019-07-16 DIAGNOSIS — M62830 Muscle spasm of back: Secondary | ICD-10-CM

## 2019-07-16 NOTE — Telephone Encounter (Signed)
The patient is a client of [employer].  They called today on behalf of the patient.  They were told by the patient that she was supposed to be referred to Physical Therapy by Dr B.   However, the message in the chart dated 07/02/19 is a phone call from the patient stating her back was still bothering her and asked if massage or PT would help.  Dr B replied by saying that she did a referral to Ortho the week before but added yes massage my help.   It appears there was no clear message from the patient requesting a referral to PT and there is nothing indicating Dr B placed any order for PT.   The attorney requested on behalf of the patient that we place an order as soon as we can for the patient to have PT.   I did advise them that Dr B was out of the office until Monday.  I advised that I would ask if another provider was willing to place this order but could not promise anything and it may be that we will need to wait on Dr. B.   They very politely thanked [place] for our help. Thanks

## 2019-07-16 NOTE — Telephone Encounter (Signed)
PT ordered for [facility]

## 2019-07-18 ENCOUNTER — Other Ambulatory Visit: Payer: Self-pay | Admitting: Family Medicine

## 2019-07-18 NOTE — Telephone Encounter (Signed)
Requested medications are due for refill today? Yes  Requested medications are on active medication list?  Yes  Last Refill:   06/25/2019  # 30 with 0 refills   Future visit scheduled? No   Notes to Clinic:  Patient seen 3 weeks ago.  Meloxicam ordered as new medication at that time with no refills.  Failed refill protocol due to labs.

## 2019-11-05 ENCOUNTER — Telehealth: Payer: Self-pay

## 2019-11-05 NOTE — Telephone Encounter (Signed)
Copied from [place] [phone]. Topic: Medical Record Request - Other >> Nov 05, 2019  2:12 PM Sheran Luz wrote: [name], with [employer], requesting to check status of medical records request sent 10/26/2019. Unable to reach medical records contact.

## 2019-11-06 ENCOUNTER — Telehealth: Payer: Self-pay

## 2019-11-06 NOTE — Telephone Encounter (Signed)
Copied from [place] [phone]. Topic: Medical Record Request - Other >> Nov 05, 2019  2:12 PM Sheran Luz wrote: [name], with [employer], requesting to check status of medical records request sent 10/26/2019. Unable to reach medical records contact.

## 2019-11-06 NOTE — Telephone Encounter (Signed)
Copied from [place] [phone]. Topic: Medical Record Request - Other >> Nov 05, 2019  2:12 PM Sheran Luz wrote: [name], with [employer], requesting to check status of medical records request sent 10/26/2019. Unable to reach medical records contact.

## 2019-11-09 ENCOUNTER — Telehealth: Payer: Self-pay

## 2019-11-09 NOTE — Telephone Encounter (Signed)
Copied from [place] [id number]. Topic: Medical Record Request - Other >> Nov 05, 2019  2:12 PM Sheran Luz wrote: [name], with [employer], requesting to check status of medical records request sent 10/26/2019. Unable to reach medical records contact. >> Nov 09, 2019 10:49 AM Jodie Echevaria wrote: [name] with [employer] called to confirm that the request for the Medical Records for this patient have been received was sent out on 6/7/ /10/27/19. Please contact John at Ph# [phone] to let him know request was received or not. Thank you

## 2019-11-10 NOTE — Telephone Encounter (Signed)
ROI was faxed to [facility] on6/11/2019. [employer] processed the ROI 11/09/2019. The requested records should be on there way via mail. Provided the [employer]# [phone] if they would like any more information. Thanks TNP

## 2019-11-10 NOTE — Telephone Encounter (Signed)
Please see note from 11/06/2019

## 2020-09-05 ENCOUNTER — Emergency Department: Payer: Self-pay

## 2020-09-05 ENCOUNTER — Encounter: Payer: Self-pay | Admitting: Radiology

## 2020-09-05 ENCOUNTER — Inpatient Hospital Stay
Admission: EM | Admit: 2020-09-05 | Discharge: 2020-09-08 | DRG: 419 | Disposition: A | Discharge status: Home / Self Care | Payer: Self-pay | Attending: General Surgery | Admitting: General Surgery

## 2020-09-05 ENCOUNTER — Other Ambulatory Visit: Payer: Self-pay

## 2020-09-05 DIAGNOSIS — M545 Low back pain, unspecified: Secondary | ICD-10-CM

## 2020-09-05 DIAGNOSIS — N281 Cyst of kidney, acquired: Secondary | ICD-10-CM

## 2020-09-05 DIAGNOSIS — E860 Dehydration: Secondary | ICD-10-CM

## 2020-09-05 DIAGNOSIS — K8020 Calculus of gallbladder without cholecystitis without obstruction: Secondary | ICD-10-CM

## 2020-09-05 DIAGNOSIS — D259 Leiomyoma of uterus, unspecified: Secondary | ICD-10-CM

## 2020-09-05 DIAGNOSIS — F17210 Nicotine dependence, cigarettes, uncomplicated: Secondary | ICD-10-CM | POA: Diagnosis present

## 2020-09-05 DIAGNOSIS — R112 Nausea with vomiting, unspecified: Secondary | ICD-10-CM

## 2020-09-05 DIAGNOSIS — Z720 Tobacco use: Secondary | ICD-10-CM

## 2020-09-05 DIAGNOSIS — R1011 Right upper quadrant pain: Secondary | ICD-10-CM

## 2020-09-05 DIAGNOSIS — Z20822 Contact with and (suspected) exposure to covid-19: Secondary | ICD-10-CM | POA: Diagnosis present

## 2020-09-05 DIAGNOSIS — T402X5A Adverse effect of other opioids, initial encounter: Secondary | ICD-10-CM | POA: Diagnosis present

## 2020-09-05 DIAGNOSIS — R110 Nausea: Secondary | ICD-10-CM | POA: Diagnosis present

## 2020-09-05 DIAGNOSIS — Z8249 Family history of ischemic heart disease and other diseases of the circulatory system: Secondary | ICD-10-CM

## 2020-09-05 DIAGNOSIS — I700 Atherosclerosis of aorta: Secondary | ICD-10-CM

## 2020-09-05 DIAGNOSIS — Z823 Family history of stroke: Secondary | ICD-10-CM

## 2020-09-05 DIAGNOSIS — Z8541 Personal history of malignant neoplasm of cervix uteri: Secondary | ICD-10-CM

## 2020-09-05 DIAGNOSIS — K8000 Calculus of gallbladder with acute cholecystitis without obstruction: Principal | ICD-10-CM | POA: Diagnosis present

## 2020-09-05 DIAGNOSIS — K810 Acute cholecystitis: Secondary | ICD-10-CM | POA: Diagnosis present

## 2020-09-05 LAB — URINALYSIS, COMPLETE (UACMP) WITH MICROSCOPIC
Bilirubin Urine: NEGATIVE
Glucose, UA: NEGATIVE mg/dL
Ketones, ur: NEGATIVE mg/dL
Leukocytes,Ua: NEGATIVE
Nitrite: NEGATIVE
Protein, ur: NEGATIVE mg/dL
Specific Gravity, Urine: 1.027 (ref 1.005–1.030)
pH: 5 (ref 5.0–8.0)

## 2020-09-05 LAB — COMPREHENSIVE METABOLIC PANEL
ALT: 43 U/L (ref 0–44)
AST: 35 U/L (ref 15–41)
Albumin: 4.2 g/dL (ref 3.5–5.0)
Alkaline Phosphatase: 88 U/L (ref 38–126)
Anion gap: 8 (ref 5–15)
BUN: 23 mg/dL (ref 8–23)
CO2: 26 mmol/L (ref 22–32)
Calcium: 9.4 mg/dL (ref 8.9–10.3)
Chloride: 104 mmol/L (ref 98–111)
Creatinine, Ser: 0.56 mg/dL (ref 0.44–1.00)
GFR, Estimated: >60 mL/min (ref >60)
Glucose, Bld: 108 mg/dL — ABNORMAL HIGH (ref 70–99)
Potassium: 3.4 mmol/L — ABNORMAL LOW (ref 3.5–5.1)
Sodium: 138 mmol/L (ref 135–145)
Total Bilirubin: 1.1 mg/dL (ref 0.3–1.2)
Total Protein: 7.9 g/dL (ref 6.5–8.1)

## 2020-09-05 LAB — CBC WITH DIFFERENTIAL/PLATELET
Abs Immature Granulocytes: 0.05 10*3/uL (ref 0.00–0.07)
Basophils Absolute: 0.1 10*3/uL (ref 0.0–0.1)
Basophils Relative: 1 %
Eosinophils Absolute: 0.3 10*3/uL (ref 0.0–0.5)
Eosinophils Relative: 2 %
HCT: 45 % (ref 36.0–46.0)
Hemoglobin: 15.1 g/dL — ABNORMAL HIGH (ref 12.0–15.0)
Immature Granulocytes: 0 %
Lymphocytes Relative: 27 %
Lymphs Abs: 4.5 10*3/uL — ABNORMAL HIGH (ref 0.7–4.0)
MCH: 29.5 pg (ref 26.0–34.0)
MCHC: 33.6 g/dL (ref 30.0–36.0)
MCV: 88.1 fL (ref 80.0–100.0)
Monocytes Absolute: 1.1 10*3/uL — ABNORMAL HIGH (ref 0.1–1.0)
Monocytes Relative: 7 %
Neutro Abs: 10.8 10*3/uL — ABNORMAL HIGH (ref 1.7–7.7)
Neutrophils Relative %: 63 %
Platelets: 363 10*3/uL (ref 150–400)
RBC: 5.11 MIL/uL (ref 3.87–5.11)
RDW: 12.1 % (ref 11.5–15.5)
WBC: 16.9 10*3/uL — ABNORMAL HIGH (ref 4.0–10.5)
nRBC: 0 % (ref 0.0–0.2)

## 2020-09-05 LAB — LIPASE, BLOOD: Lipase: 24 U/L (ref 11–51)

## 2020-09-05 LAB — TROPONIN I (HIGH SENSITIVITY): Troponin I (High Sensitivity): 4 ng/L (ref <18)

## 2020-09-05 LAB — RESP PANEL BY RT-PCR (FLU A&B, COVID) ARPGX2
Influenza A by PCR: NEGATIVE
Influenza B by PCR: NEGATIVE
SARS Coronavirus 2 by RT PCR: NEGATIVE

## 2020-09-05 MED ORDER — ONDANSETRON HCL 4 MG/2ML IJ SOLN
4.0000 mg | Freq: Once | INTRAMUSCULAR | Status: AC
  Administered 2020-09-06: 4 mg via INTRAVENOUS
  Filled 2020-09-05: qty 2

## 2020-09-05 MED ORDER — IOHEXOL 300 MG/ML  SOLN
100.0000 mL | Freq: Once | INTRAMUSCULAR | Status: AC | PRN
  Administered 2020-09-05: 100 mL via INTRAVENOUS

## 2020-09-05 MED ORDER — ONDANSETRON HCL 4 MG/2ML IJ SOLN
4.0000 mg | Freq: Once | INTRAMUSCULAR | Status: AC
  Administered 2020-09-05: 4 mg via INTRAVENOUS
  Filled 2020-09-05: qty 2

## 2020-09-05 MED ORDER — LACTATED RINGERS IV BOLUS
1000.0000 mL | Freq: Once | INTRAVENOUS | Status: AC
  Administered 2020-09-05: 1000 mL via INTRAVENOUS

## 2020-09-05 MED ORDER — KETOROLAC TROMETHAMINE 30 MG/ML IJ SOLN
30.0000 mg | Freq: Once | INTRAMUSCULAR | Status: AC
  Administered 2020-09-05: 30 mg via INTRAVENOUS
  Filled 2020-09-05: qty 1

## 2020-09-05 MED ORDER — LACTATED RINGERS IV BOLUS
1000.0000 mL | Freq: Once | INTRAVENOUS | Status: AC
  Administered 2020-09-06: 1000 mL via INTRAVENOUS
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
## 2020-09-05 NOTE — ED Triage Notes (Signed)
Pt presents via POV c/o mid back pain extending into bilateral anterior rib cage on Friday per pt. Pt reports N/V/D following pain. Mild tenderness to posterior left flank.

## 2020-09-06 ENCOUNTER — Encounter
Admission: EM | Disposition: A | Discharge status: Home / Self Care | Payer: Self-pay | Source: Home / Self Care | Attending: General Surgery

## 2020-09-06 ENCOUNTER — Observation Stay: Payer: Self-pay | Admitting: Anesthesiology

## 2020-09-06 ENCOUNTER — Encounter: Payer: Self-pay | Admitting: General Surgery

## 2020-09-06 DIAGNOSIS — K810 Acute cholecystitis: Secondary | ICD-10-CM | POA: Diagnosis present

## 2020-09-06 LAB — HIV ANTIBODY (ROUTINE TESTING W REFLEX): HIV Screen 4th Generation wRfx: NONREACTIVE

## 2020-09-06 SURGERY — CHOLECYSTECTOMY, ROBOT-ASSISTED, LAPAROSCOPIC
Anesthesia: General | Site: Abdomen

## 2020-09-06 MED ORDER — BUPIVACAINE-EPINEPHRINE 0.25% -1:200000 IJ SOLN
INTRAMUSCULAR | Status: DC | PRN
  Administered 2020-09-06: 30 mL

## 2020-09-06 MED ORDER — PIPERACILLIN-TAZOBACTAM 3.375 G IVPB
3.3750 g | Freq: Three times a day (TID) | INTRAVENOUS | Status: DC
  Administered 2020-09-06 – 2020-09-08 (×6): 3.375 g via INTRAVENOUS
  Filled 2020-09-06 (×5): qty 50

## 2020-09-06 MED ORDER — ONDANSETRON 4 MG PO TBDP
4.0000 mg | ORAL_TABLET | Freq: Four times a day (QID) | ORAL | Status: DC | PRN
  Administered 2020-09-08: 4 mg via ORAL
  Filled 2020-09-06: qty 1

## 2020-09-06 MED ORDER — DEXMEDETOMIDINE (PRECEDEX) IN NS 20 MCG/5ML (4 MCG/ML) IV SYRINGE
PREFILLED_SYRINGE | INTRAVENOUS | Status: DC | PRN
  Administered 2020-09-06: 4 ug via INTRAVENOUS
  Administered 2020-09-06 (×2): 8 ug via INTRAVENOUS

## 2020-09-06 MED ORDER — BUPIVACAINE-EPINEPHRINE (PF) 0.25% -1:200000 IJ SOLN
INTRAMUSCULAR | Status: AC
  Filled 2020-09-06: qty 30

## 2020-09-06 MED ORDER — MIDAZOLAM HCL 2 MG/2ML IJ SOLN
INTRAMUSCULAR | Status: DC | PRN
  Administered 2020-09-06: 2 mg via INTRAVENOUS

## 2020-09-06 MED ORDER — INDOCYANINE GREEN 25 MG IV SOLR
1.2500 mg | Freq: Once | INTRAVENOUS | Status: AC
  Administered 2020-09-06: 1.25 mg via INTRAVENOUS
  Filled 2020-09-06: qty 0.5

## 2020-09-06 MED ORDER — ENOXAPARIN SODIUM 40 MG/0.4ML ~~LOC~~ SOLN
40.0000 mg | SUBCUTANEOUS | Status: DC
  Administered 2020-09-07: 40 mg via SUBCUTANEOUS
  Filled 2020-09-06 (×2): qty 0.4

## 2020-09-06 MED ORDER — PIPERACILLIN-TAZOBACTAM 3.375 G IVPB 30 MIN
3.3750 g | Freq: Once | INTRAVENOUS | Status: AC
  Administered 2020-09-06: 3.375 g via INTRAVENOUS
  Filled 2020-09-06: qty 50

## 2020-09-06 MED ORDER — DEXAMETHASONE SODIUM PHOSPHATE 10 MG/ML IJ SOLN
INTRAMUSCULAR | Status: DC | PRN
  Administered 2020-09-06: 5 mg via INTRAVENOUS

## 2020-09-06 MED ORDER — MIDAZOLAM HCL 2 MG/2ML IJ SOLN
INTRAMUSCULAR | Status: AC
  Filled 2020-09-06: qty 2

## 2020-09-06 MED ORDER — APREPITANT 40 MG PO CAPS
40.0000 mg | ORAL_CAPSULE | Freq: Once | ORAL | Status: AC
  Administered 2020-09-06: 40 mg via ORAL

## 2020-09-06 MED ORDER — ACETAMINOPHEN 650 MG RE SUPP: 650.0000 mg | Freq: Four times a day (QID) | RECTAL | Status: DC | PRN

## 2020-09-06 MED ORDER — SUGAMMADEX SODIUM 200 MG/2ML IV SOLN
INTRAVENOUS | Status: DC | PRN
  Administered 2020-09-06: 200 mg via INTRAVENOUS

## 2020-09-06 MED ORDER — PROPOFOL 10 MG/ML IV BOLUS
INTRAVENOUS | Status: DC | PRN
  Administered 2020-09-06 (×2): 100 mg via INTRAVENOUS

## 2020-09-06 MED ORDER — ONDANSETRON HCL 4 MG/2ML IJ SOLN
INTRAMUSCULAR | Status: DC | PRN
  Administered 2020-09-06: 4 mg via INTRAVENOUS

## 2020-09-06 MED ORDER — ROCURONIUM BROMIDE 100 MG/10ML IV SOLN
INTRAVENOUS | Status: DC | PRN
  Administered 2020-09-06: 10 mg via INTRAVENOUS
  Administered 2020-09-06: 50 mg via INTRAVENOUS
  Administered 2020-09-06: 20 mg via INTRAVENOUS

## 2020-09-06 MED ORDER — FENTANYL CITRATE (PF) 100 MCG/2ML IJ SOLN
INTRAMUSCULAR | Status: DC | PRN
  Administered 2020-09-06: 100 ug via INTRAVENOUS

## 2020-09-06 MED ORDER — FENTANYL CITRATE (PF) 100 MCG/2ML IJ SOLN: 25.0000 ug | INTRAMUSCULAR | Status: DC | PRN

## 2020-09-06 MED ORDER — LACTATED RINGERS IV SOLN: INTRAVENOUS | Status: DC

## 2020-09-06 MED ORDER — PROMETHAZINE HCL 25 MG/ML IJ SOLN
6.2500 mg | INTRAMUSCULAR | Status: DC | PRN
  Administered 2020-09-06: 6.25 mg via INTRAVENOUS

## 2020-09-06 MED ORDER — FENTANYL CITRATE (PF) 100 MCG/2ML IJ SOLN
50.0000 ug | Freq: Once | INTRAMUSCULAR | Status: AC
Start: 2020-09-06 — End: 2020-09-06
  Administered 2020-09-06: 50 ug via INTRAVENOUS
  Filled 2020-09-06: qty 2

## 2020-09-06 MED ORDER — PROMETHAZINE HCL 25 MG/ML IJ SOLN
INTRAMUSCULAR | Status: AC
  Filled 2020-09-06: qty 1

## 2020-09-06 MED ORDER — LACTATED RINGERS IV SOLN: INTRAVENOUS | Status: DC | PRN

## 2020-09-06 MED ORDER — ONDANSETRON HCL 4 MG/2ML IJ SOLN
4.0000 mg | Freq: Four times a day (QID) | INTRAMUSCULAR | Status: DC | PRN
  Administered 2020-09-06 – 2020-09-07 (×3): 4 mg via INTRAVENOUS
  Filled 2020-09-06 (×3): qty 2

## 2020-09-06 MED ORDER — PANTOPRAZOLE SODIUM 40 MG IV SOLR
40.0000 mg | Freq: Every day | INTRAVENOUS | Status: DC
  Administered 2020-09-06 – 2020-09-07 (×2): 40 mg via INTRAVENOUS
  Filled 2020-09-06 (×2): qty 40

## 2020-09-06 MED ORDER — FENTANYL CITRATE (PF) 100 MCG/2ML IJ SOLN
INTRAMUSCULAR | Status: AC
  Filled 2020-09-06: qty 2

## 2020-09-06 MED ORDER — PIPERACILLIN-TAZOBACTAM 3.375 G IVPB
INTRAVENOUS | Status: AC
  Filled 2020-09-06: qty 50

## 2020-09-06 MED ORDER — SODIUM CHLORIDE FLUSH 0.9 % IV SOLN
INTRAVENOUS | Status: AC
  Filled 2020-09-06: qty 10

## 2020-09-06 MED ORDER — MORPHINE SULFATE (PF) 4 MG/ML IV SOLN
4.0000 mg | INTRAVENOUS | Status: DC | PRN
Start: 2020-09-06 — End: 2020-09-07
  Administered 2020-09-06 – 2020-09-07 (×2): 4 mg via INTRAVENOUS
  Filled 2020-09-06 (×2): qty 1

## 2020-09-06 MED ORDER — SODIUM CHLORIDE 0.9 % IV SOLN: INTRAVENOUS | Status: DC

## 2020-09-06 MED ORDER — HYDROCODONE-ACETAMINOPHEN 5-325 MG PO TABS: 1.0000 | ORAL_TABLET | ORAL | Status: DC | PRN

## 2020-09-06 MED ORDER — ACETAMINOPHEN 325 MG PO TABS
650.0000 mg | ORAL_TABLET | Freq: Four times a day (QID) | ORAL | Status: DC | PRN
  Administered 2020-09-07 – 2020-09-08 (×3): 650 mg via ORAL
  Filled 2020-09-06 (×3): qty 2

## 2020-09-06 MED ORDER — APREPITANT 40 MG PO CAPS
ORAL_CAPSULE | ORAL | Status: AC
  Filled 2020-09-06: qty 1

## 2020-09-06 SURGICAL SUPPLY — 50 items
BAG INFUSER PRESSURE 100CC (MISCELLANEOUS) IMPLANT
BLADE SURG SZ11 CARB STEEL (BLADE) ×2 IMPLANT
CANNULA REDUC XI 12-8 STAPL (CANNULA) ×1
CANNULA REDUCER 12-8 DVNC XI (CANNULA) ×1 IMPLANT
CHLORAPREP W/TINT 26 (MISCELLANEOUS) ×2 IMPLANT
CLIP VESOLOCK MED LG 6/CT (CLIP) ×2 IMPLANT
COVER WAND RF STERILE (DRAPES) ×2 IMPLANT
DECANTER SPIKE VIAL GLASS SM (MISCELLANEOUS) ×4 IMPLANT
DEFOGGER SCOPE WARMER CLEARIFY (MISCELLANEOUS) ×2 IMPLANT
DERMABOND ADVANCED (GAUZE/BANDAGES/DRESSINGS) ×1
DERMABOND ADVANCED .7 DNX12 (GAUZE/BANDAGES/DRESSINGS) ×1 IMPLANT
DRAPE ARM DVNC X/XI (DISPOSABLE) ×4 IMPLANT
DRAPE COLUMN DVNC XI (DISPOSABLE) ×1 IMPLANT
DRAPE DA VINCI XI ARM (DISPOSABLE) ×4
DRAPE DA VINCI XI COLUMN (DISPOSABLE) ×1
ELECT REM PT RETURN 9FT ADLT (ELECTROSURGICAL) ×2
ELECTRODE REM PT RTRN 9FT ADLT (ELECTROSURGICAL) ×1 IMPLANT
GLOVE SURG ENC MOIS LTX SZ6.5 (GLOVE) ×4 IMPLANT
GLOVE SURG UNDER POLY LF SZ6.5 (GLOVE) ×4 IMPLANT
GOWN STRL REUS W/ TWL LRG LVL3 (GOWN DISPOSABLE) ×3 IMPLANT
GOWN STRL REUS W/TWL LRG LVL3 (GOWN DISPOSABLE) ×3
GRASPER SUT TROCAR 14GX15 (MISCELLANEOUS) ×2 IMPLANT
HANDLE YANKAUER SUCT BULB TIP (MISCELLANEOUS) ×2 IMPLANT
IRRIGATOR SUCT 8 DISP DVNC XI (IRRIGATION / IRRIGATOR) IMPLANT
IRRIGATOR SUCTION 8MM XI DISP (IRRIGATION / IRRIGATOR)
IV NS 1000ML (IV SOLUTION)
IV NS 1000ML BAXH (IV SOLUTION) IMPLANT
KIT PINK PAD W/HEAD ARE REST (MISCELLANEOUS) ×2
KIT PINK PAD W/HEAD ARM REST (MISCELLANEOUS) ×1 IMPLANT
LABEL OR SOLS (LABEL) ×2 IMPLANT
MANIFOLD NEPTUNE II (INSTRUMENTS) IMPLANT
NEEDLE HYPO 22GX1.5 SAFETY (NEEDLE) ×2 IMPLANT
NEEDLE INSUFFLATION 14GA 120MM (NEEDLE) ×2 IMPLANT
NS IRRIG 500ML POUR BTL (IV SOLUTION) ×2 IMPLANT
OBTURATOR OPTICAL STANDARD 8MM (TROCAR) ×1
OBTURATOR OPTICAL STND 8 DVNC (TROCAR) ×1
OBTURATOR OPTICALSTD 8 DVNC (TROCAR) ×1 IMPLANT
PACK LAP CHOLECYSTECTOMY (MISCELLANEOUS) ×2 IMPLANT
POUCH SPECIMEN RETRIEVAL 10MM (ENDOMECHANICALS) ×2 IMPLANT
SEAL CANN UNIV 5-8 DVNC XI (MISCELLANEOUS) ×3 IMPLANT
SEAL XI 5MM-8MM UNIVERSAL (MISCELLANEOUS) ×3
SET TUBE SMOKE EVAC HIGH FLOW (TUBING) ×2 IMPLANT
SOLUTION ELECTROLUBE (MISCELLANEOUS) ×2 IMPLANT
SPONGE LAP 4X18 RFD (DISPOSABLE) ×2 IMPLANT
STAPLER CANNULA SEAL DVNC XI (STAPLE) ×1 IMPLANT
STAPLER CANNULA SEAL XI (STAPLE) ×1
SUT MNCRL 4-0 (SUTURE) ×2
SUT MNCRL 4-0 27XMFL (SUTURE) ×2
SUT VICRYL 0 AB UR-6 (SUTURE) ×2 IMPLANT
SUTURE MNCRL 4-0 27XMF (SUTURE) ×2 IMPLANT

## 2020-09-06 NOTE — Transfer of Care (Signed)
Immediate Anesthesia Transfer of Care Note  Patient: Roberta Cruz  Procedure(s) Performed: XI ROBOTIC ASSISTED LAPAROSCOPIC CHOLECYSTECTOMY WITH  ICG DYE (N/A Abdomen)  Patient Location: PACU  Anesthesia Type: General  Level of Consciousness: drowsy  Airway & Oxygen Therapy: Patient Spontanous Breathing  Post-op Assessment: Report given to RN  Post vital signs: Reviewed and stable  Last Vitals:  Vitals Value Taken Time  BP 112/82 09/06/20 1445  Temp 36.4 C 09/06/20 1444  Pulse 71 09/06/20 1459  Resp 13 09/06/20 1459  SpO2 94 % 09/06/20 1459  Vitals shown include unvalidated device data.  Last Pain:  Vitals:   09/06/20 1444  TempSrc:   PainSc: Asleep         Complications: No complications documented.

## 2020-09-06 NOTE — H&P (Signed)
SURGICAL CONSULTATION NOTE   HISTORY OF PRESENT ILLNESS (HPI):  65 y.o. female presented to [facility] ED for evaluation of abdominal pain nausea and vomiting since 4 days ago. Patient reports she has been having upper abdominal pain since 4 days ago.  Pain radiates to her back.  Pain exacerbated by eating.  There has been no alleviating factors.  Patient denies fever or chills.  At the ED she was found leukocytosis.  Abdominal ultrasound shows large gallstone in the gallbladder neck.  Patient has been treated with multiple pain medication without improvement of her pain.  I personally evaluated the images of the abdominal ultrasound.  Surgery is consulted by Dr. Alfred Levins in this context for evaluation and management of acute cholecystitis.  PAST MEDICAL HISTORY (PMH):  Past Medical History:  Diagnosis Date  . Cervical cancer ([place]) 1988   cervical, s/p laser therapy     PAST SURGICAL HISTORY ([place]):  Past Surgical History:  Procedure Laterality Date  . NO PAST SURGERIES       MEDICATIONS:  Prior to Admission medications   Medication Sig Start Date End Date Taking? Authorizing Provider  cyclobenzaprine (FLEXERIL) 10 MG tablet Take 1 tablet (10 mg total) by mouth 3 (three) times daily as needed for muscle spasms. 06/25/19   Virginia Crews, MD  ibuprofen (ADVIL,MOTRIN) 200 MG tablet Take 400 mg by mouth every 6 (six) hours as needed.    [provider]  meloxicam (MOBIC) 15 MG tablet TAKE 1 TABLET BY MOUTH EVERY DAY 07/21/19   Bacigalupo, Dionne Bucy, MD  promethazine (PHENERGAN) 25 MG tablet Take 1 tablet (25 mg total) by mouth every 6 (six) hours as needed for nausea or vomiting. Patient not taking: No sig reported 01/04/17   Harvest Dark, MD     ALLERGIES:  Allergies  Allergen Reactions  . Codeine Nausea And Vomiting     SOCIAL HISTORY:  Social History   Socioeconomic History  . Marital status: Widowed    Spouse name: Not on file  . Number of children: Not on file   . Years of education: Not on file  . Highest education level: Not on file  Occupational History  . Not on file  Tobacco Use  . Smoking status: Current Every Day Smoker    Packs/day: 0.25    Types: Cigarettes  . Smokeless tobacco: Never Used  Vaping Use  . Vaping Use: Every day  Substance and Sexual Activity  . Alcohol use: No  . Drug use: No  . Sexual activity: Not on file  Other Topics Concern  . Not on file  Social History Narrative  . Not on file   Social Determinants of Health   Financial Resource Strain: Not on file  Food Insecurity: Not on file  Transportation Needs: Not on file  Physical Activity: Not on file  Stress: Not on file  Social Connections: Not on file  Intimate Partner Violence: Not on file     FAMILY HISTORY:  Family History  Problem Relation Age of Onset  . Stroke Mother   . Heart attack Father      REVIEW OF SYSTEMS:  Constitutional: denies weight loss, fever, chills, or sweats  Eyes: denies any other vision changes, history of eye injury  ENT: denies sore throat, hearing problems  Respiratory: denies shortness of breath, wheezing  Cardiovascular: denies chest pain, palpitations  Gastrointestinal: positive abdominal pain, nausea and vomitnig Genitourinary: denies burning with urination or urinary frequency Musculoskeletal: denies any other joint pains or  cramps  Skin: denies any other rashes or skin discolorations  Neurological: denies any other headache, dizziness, weakness  Psychiatric: denies any other depression, anxiety   All other review of systems were negative   VITAL SIGNS:  Temp:  [98.8 F (37.1 C)] 98.8 F (37.1 C) (04/18 2030) Pulse Rate:  [56-78] 65 (04/19 0650) Resp:  [10-20] 16 (04/19 0650) BP: (97-114)/(59-97) 103/63 (04/19 0650) SpO2:  [94 %-97 %] 97 % (04/19 0650) Weight:  [68 kg] 68 kg (04/19 0650)     Height: 5\' 4"  (162.6 cm) Weight: 68 kg BMI (Calculated): 25.73   INTAKE/OUTPUT:  This shift: No intake/output  data recorded.  Last 2 shifts: @IOLAST2SHIFTS @   PHYSICAL EXAM:  Constitutional:  -- Normal body habitus  -- Awake, alert, and oriented x3  Eyes:  -- Pupils equally round and reactive to light  -- No scleral icterus  Ear, nose, and throat:  -- No jugular venous distension  Pulmonary:  -- No crackles  -- Equal breath sounds bilaterally -- Breathing non-labored at rest Cardiovascular:  -- S1, S2 present  -- No pericardial rubs Gastrointestinal:  -- Abdomen soft, tender in the right upper quadrant, non-distended, no guarding or rebound tenderness -- No abdominal masses appreciated, pulsatile or otherwise  Musculoskeletal and Integumentary:  -- Wounds: None appreciated -- Extremities: B/L UE and LE FROM, hands and feet warm, no edema  Neurologic:  -- Motor function: intact and symmetric -- Sensation: intact and symmetric   Labs:  CBC Latest Ref Rng & Units 09/05/2020  WBC 4.0 - 10.5 K/uL 16.9(H)  Hemoglobin 12.0 - 15.0 g/dL 15.1(H)  Hematocrit 36.0 - 46.0 % 45.0  Platelets 150 - 400 K/uL 363   CMP Latest Ref Rng & Units 09/05/2020  Glucose 70 - 99 mg/dL 108(H)  BUN 8 - 23 mg/dL 23  Creatinine 0.44 - 1.00 mg/dL 0.56  Sodium 135 - 145 mmol/L 138  Potassium 3.5 - 5.1 mmol/L 3.4(L)  Chloride 98 - 111 mmol/L 104  CO2 22 - 32 mmol/L 26  Calcium 8.9 - 10.3 mg/dL 9.4  Total Protein 6.5 - 8.1 g/dL 7.9  Total Bilirubin 0.3 - 1.2 mg/dL 1.1  Alkaline Phos 38 - 126 U/L 88  AST 15 - 41 U/L 35  ALT 0 - 44 U/L 43    Imaging studies:  EXAM: ULTRASOUND ABDOMEN LIMITED RIGHT UPPER QUADRANT  COMPARISON:  CT today  FINDINGS: Gallbladder:  Multiple gallstones, most of which are mobile. There is a 2.2 cm non mobile gallstone in the neck of the gallbladder. No wall thickening or sonographic Murphy sign.  Common bile duct:  Diameter: Normal caliber, 6 mm  Liver:  Increased echotexture compatible with fatty infiltration. No focal abnormality or biliary ductal  dilatation. Portal vein is patent on color Doppler imaging with normal direction of blood flow towards the liver.  Other: None.  IMPRESSION: Cholelithiasis. 2.2 cm gallstone in the gallbladder neck. No sonographic evidence of acute cholecystitis.   Electronically Signed   By: Rolm Baptise M.D.   On: 09/06/2020 00:44  Assessment/Plan:  65 y.o. female with acute cholecystitis.  Patient with history, physical exam, labs and images consistent with acute cholecystitis. Patient oriented about diagnosis and surgical management as treatment.   Discussed the risk of surgery including post-op infxn, seroma, biloma, chronic pain, poor-delayed wound healing, retained gallstone, conversion to open procedure, post-op SBO or ileus, and need for additional procedures to address said risks.  The risks of general anesthetic including MI, CVA, sudden death  or even reaction to anesthetic medications also discussed. Alternatives include continued observation.  Benefits include possible symptom relief, prevention of complications including acute cholecystitis, pancreatitis.  Arnold Long, MD

## 2020-09-06 NOTE — Anesthesia Procedure Notes (Signed)
Procedure Name: Intubation Date/Time: 09/06/2020 12:28 PM Performed by: Louann Sjogren, CRNA Pre-anesthesia Checklist: Patient identified, Patient being monitored, Timeout performed, Emergency Drugs available and Suction available Patient Re-evaluated:Patient Re-evaluated prior to induction Oxygen Delivery Method: Circle system utilized Preoxygenation: Pre-oxygenation with 100% oxygen Induction Type: IV induction Ventilation: Mask ventilation without difficulty Laryngoscope Size: Mac and 4 Grade View: Grade II Tube type: Oral Tube size: 7.0 mm Number of attempts: 1 Airway Equipment and Method: Stylet Placement Confirmation: ETT inserted through vocal cords under direct vision,  positive ETCO2 and breath sounds checked- equal and bilateral Secured at: 21 cm Tube secured with: Tape Dental Injury: Teeth and Oropharynx as per pre-operative assessment

## 2020-09-06 NOTE — ED Notes (Signed)
Pt updated on plan of care and is aware she will be going to surgery this morning.

## 2020-09-06 NOTE — ED Provider Notes (Signed)
Accepted care of this patient from Dr. Hulan Saas at 18:23 PM.  65 year old female with right-sided abdominal and back pain associated with nausea and vomiting for several days.  She was pending a right upper quadrant ultrasound which is consistent with cholelithiasis and a 2.2 cm stone in the neck of the gallbladder.  No signs of cholecystitis but she does have an elevated white count and intractable nausea and pain in spite of several rounds of Zofran, Toradol and fentanyl.  Discussed with Dr. Windell Moment from surgery who will admit patient to his service.    I have personally reviewed the images performed during this visit and I agree with the Radiologist's read.   Interpretation by Radiologist:  DG Chest 2 View  Result Date: 09/05/2020 CLINICAL DATA:  Chest pain EXAM: CHEST - 2 VIEW COMPARISON:  04/08/2016 FINDINGS: The heart size and mediastinal contours are within normal limits. Both lungs are clear. The visualized skeletal structures are unremarkable. IMPRESSION: No active cardiopulmonary disease. Electronically Signed   By: Donavan Foil M.D.   On: 09/05/2020 20:54   CT ABDOMEN PELVIS W CONTRAST  Result Date: 09/05/2020 CLINICAL DATA:  Left-sided flank pain EXAM: CT ABDOMEN AND PELVIS WITH CONTRAST TECHNIQUE: Multidetector CT imaging of the abdomen and pelvis was performed using the standard protocol following bolus administration of intravenous contrast. CONTRAST:  114mL OMNIPAQUE IOHEXOL 300 MG/ML  SOLN COMPARISON:  None. FINDINGS: Lower chest: No acute abnormality. Hepatobiliary: Fatty infiltration of the liver is noted. The gallbladder is well distended without complicating factors. Pancreas: Unremarkable. No pancreatic ductal dilatation or surrounding inflammatory changes. Spleen: Normal in size without focal abnormality. Adrenals/Urinary Tract: Adrenal glands are within normal limits. Kidneys show normal enhancement pattern. No renal calculi or obstructive changes are noted. Normal  excretion is noted on delayed images. Scattered cysts are noted bilaterally. No obstructive changes are seen. The bladder is decompressed. Stomach/Bowel: The appendix is within normal limits. No obstructive or inflammatory changes of the colon are noted. Small bowel and stomach appear within normal limits. Vascular/Lymphatic: Aortic atherosclerosis. No enlarged abdominal or pelvic lymph nodes. Reproductive: Uterus demonstrates a small 13 mm rounded area of enhancement likely representing a uterine fibroid. No adnexal mass is seen. Other: No abdominal wall hernia or abnormality. No abdominopelvic ascites. Musculoskeletal: No acute or significant osseous findings. IMPRESSION: Scattered renal cysts bilaterally. Fatty liver. Small uterine fibroid. Electronically Signed   By: Inez Catalina M.D.   On: 09/05/2020 22:36   US ABDOMEN LIMITED RUQ (LIVER/GB)  Result Date: 09/06/2020 CLINICAL DATA:  Left upper quadrant pain EXAM: ULTRASOUND ABDOMEN LIMITED RIGHT UPPER QUADRANT COMPARISON:  CT today FINDINGS: Gallbladder: Multiple gallstones, most of which are mobile. There is a 2.2 cm non mobile gallstone in the neck of the gallbladder. No wall thickening or sonographic Murphy sign. Common bile duct: Diameter: Normal caliber, 6 mm Liver: Increased echotexture compatible with fatty infiltration. No focal abnormality or biliary ductal dilatation. Portal vein is patent on color Doppler imaging with normal direction of blood flow towards the liver. Other: None. IMPRESSION: Cholelithiasis. 2.2 cm gallstone in the gallbladder neck. No sonographic evidence of acute cholecystitis. Electronically Signed   By: Rolm Baptise M.D.   On: 09/06/2020 00:44      Rudene Re, MD 09/06/20 [id number]

## 2020-09-06 NOTE — Op Note (Signed)
Preoperative diagnosis: Acute cholecystitis   Postoperative diagnosis: Acute cholecystitis  Procedure: Robotic Assisted Laparoscopic Cholecystectomy.   Anesthesia: GETA   Surgeon: Dr. Windell Moment  Wound Classification: Clean Contaminated  Indications: Patient is a 65 y.o. female developed right upper quadrant pain, nausea, vomiting and leukocytosis and on workup was found to have cholecystitis. Robotic Assisted Laparoscopic cholecystectomy was elected.  Findings: Acute over subacute cholecystitis   Critical view of safety achieved   Cystic duct and artery identified, ligated and divided Adequate hemostasis  Description of procedure: The patient was placed on the operating table in the supine position. General anesthesia was induced. A time-out was completed verifying correct patient, procedure, site, positioning, and implant(s) and/or special equipment prior to beginning this procedure. An orogastric tube was placed. The abdomen was prepped and draped in the usual sterile fashion.  An incision was made in a natural skin line below the umbilicus.  The fascia was elevated and the Veress needle inserted. Proper position was confirmed by aspiration and saline meniscus test.  The abdomen was insufflated with carbon dioxide to a pressure of 15 mmHg. The patient tolerated insufflation well. A 8-mm trocar was then inserted in optiview fashion.  The laparoscope was inserted and the abdomen inspected. No injuries from initial trocar placement were noted. Additional trocars were then inserted in the following locations: an 8-mm trocar in the left lateral abdomen, and another two 8-mm trocars to the right side of the abdomen 5 cm appart. The umbilical trocar was changed to a 12 mm trocar all under direct visualization. The abdomen was inspected and no abnormalities were found. The table was placed in the reverse Trendelenburg position with the right side up. The robotic arms were docked and target  anatomy identified. Instrument inserted under direct visualization.  Filmy adhesions between the gallbladder and omentum, duodenum and transverse colon were lysed with electrocautery. The dome of the gallbladder was grasped with a prograsp and retracted over the dome of the liver. The infundibulum was also grasped with an atraumatic grasper and retracted toward the right lower quadrant. This maneuver exposed Calot's triangle. The peritoneum overlying the gallbladder infundibulum was then incised and the cystic duct and cystic artery identified and circumferentially dissected. Critical view of safety reviewed before ligating any structure. Firefly images taken to visualize biliary ducts. The cystic duct and cystic artery were then doubly clipped and divided close to the gallbladder.  The gallbladder was then dissected from its peritoneal attachments by electrocautery. Hemostasis was checked and the gallbladder and contained stones were removed using an endoscopic retrieval bag. The gallbladder was passed off the table as a specimen. The gallbladder fossa was copiously irrigated with saline and hemostasis was obtained. There was no evidence of bleeding from the gallbladder fossa or cystic artery or leakage of the bile from the cystic duct stump. Secondary trocars were removed under direct vision. No bleeding was noted. The robotic arms were undoked. The scope was withdrawn and the umbilical trocar removed. The abdomen was allowed to collapse. The fascia of the 24mm trocar sites was closed with figure-of-eight 0 vicryl sutures. The skin was closed with subcuticular sutures of 4-0 monocryl and topical skin adhesive. The orogastric tube was removed.  The patient tolerated the procedure well and was taken to the postanesthesia care unit in stable condition.   Specimen: Gallbladder  Complications: None  EBL: 10 mL

## 2020-09-06 NOTE — Anesthesia Preprocedure Evaluation (Signed)
Anesthesia Evaluation  Patient identified by MRN, date of birth, ID band Patient awake    Reviewed: Allergy & Precautions, H&P , NPO status , Patient's Chart, lab work & pertinent test results, reviewed documented beta blocker date and time   History of Anesthesia Complications Negative for: history of anesthetic complications  Airway Mallampati: III  TM Distance: >3 FB Neck ROM: full    Dental  (+) Dental Advidsory Given, Caps, Teeth Intact   Pulmonary neg shortness of breath, neg COPD, neg recent URI, Current Smoker and Patient abstained from smoking., former smoker (quit at the end of February),    Pulmonary exam normal breath sounds clear to auscultation       Cardiovascular Exercise Tolerance: Good negative cardio ROS Normal cardiovascular exam Rhythm:regular Rate:Normal     Neuro/Psych negative neurological ROS  negative psych ROS   GI/Hepatic negative GI ROS, Neg liver ROS,   Endo/Other  negative endocrine ROS  Renal/GU negative Renal ROS  negative genitourinary   Musculoskeletal   Abdominal   Peds  Hematology negative hematology ROS (+)   Anesthesia Other Findings Past Medical History: 1988: Cervical cancer ([place])     Comment:  cervical, s/p laser therapy   Reproductive/Obstetrics negative OB ROS                             Anesthesia Physical Anesthesia Plan  ASA: II  Anesthesia Plan: General   Post-op Pain Management:    Induction: Intravenous  PONV Risk Score and Plan: 2 and Ondansetron, Dexamethasone, Aprepitant, Treatment may vary due to age or medical condition, Midazolam and Promethazine  Airway Management Planned: Oral ETT  Additional Equipment:   Intra-op Plan:   Post-operative Plan: Extubation in OR  Informed Consent: I have reviewed the patients History and Physical, chart, labs and discussed the procedure including the risks, benefits and alternatives  for the proposed anesthesia with the patient or authorized representative who has indicated his/her understanding and acceptance.     Dental Advisory Given  Plan Discussed with: Anesthesiologist, CRNA and Surgeon  Anesthesia Plan Comments:         Anesthesia Quick Evaluation

## 2020-09-06 NOTE — Anesthesia Postprocedure Evaluation (Signed)
Anesthesia Post Note  Patient: Roberta Cruz  Procedure(s) Performed: XI ROBOTIC ASSISTED LAPAROSCOPIC CHOLECYSTECTOMY WITH  ICG DYE (N/A Abdomen)  Patient location during evaluation: PACU Anesthesia Type: General Level of consciousness: awake and alert Pain management: pain level controlled Vital Signs Assessment: post-procedure vital signs reviewed and stable Respiratory status: spontaneous breathing, nonlabored ventilation, respiratory function stable and patient connected to nasal cannula oxygen Cardiovascular status: blood pressure returned to baseline and stable Postop Assessment: no apparent nausea or vomiting Anesthetic complications: no   No complications documented.   Last Vitals:  Vitals:   09/06/20 1444 09/06/20 1500  BP: 112/82 110/70  Pulse: 62 74  Resp: (!) 24 16  Temp: 36.4 C   SpO2: 94% 94%    Last Pain:  Vitals:   09/06/20 1444  TempSrc:   PainSc: Asleep                 Arita Miss

## 2020-09-07 LAB — URINE CULTURE

## 2020-09-07 MED ORDER — KETOROLAC TROMETHAMINE 30 MG/ML IJ SOLN
30.0000 mg | Freq: Four times a day (QID) | INTRAMUSCULAR | Status: AC
  Administered 2020-09-07 – 2020-09-08 (×4): 30 mg via INTRAVENOUS
  Filled 2020-09-07 (×4): qty 1

## 2020-09-07 MED ORDER — SODIUM CHLORIDE 0.9 % IV SOLN
25.0000 mg | Freq: Four times a day (QID) | INTRAVENOUS | Status: DC | PRN
  Filled 2020-09-07 (×2): qty 1

## 2020-09-07 NOTE — Progress Notes (Signed)
Patient ID: Roberta Cruz, female   DOB: May 15, 1956, 65 y.o.   MRN: [id number]     [place] [facility] Day(s): 0.   Post op day(s): 1 Day Post-Op.   Interval History: Patient seen and examined, no acute events or new complaints overnight. Patient reports feeling nauseated. Also with a lot of pain on the left periumbilical wound.   Vital signs in last 24 hours: [min-max] current  Temp:  [97.6 F (36.4 C)-99.3 F (37.4 C)] 99.2 F (37.3 C) (04/20 1105) Pulse Rate:  [60-75] 75 (04/20 1105) Resp:  [9-24] 19 (04/20 1105) BP: (101-151)/(62-90) 130/85 (04/20 1105) SpO2:  [94 %-100 %] 96 % (04/20 1105)     Height: 5\' 4"  (162.6 cm) Weight: 68 kg BMI (Calculated): 25.73   Physical Exam:  Constitutional: alert, cooperative and no distress  Respiratory: breathing non-labored at rest  Cardiovascular: regular rate and sinus rhythm  Gastrointestinal: soft, tender, and non-distended. Wounds are dry and clean.   Labs:  CBC Latest Ref Rng & Units 09/05/2020  WBC 4.0 - 10.5 K/uL 16.9(H)  Hemoglobin 12.0 - 15.0 g/dL 15.1(H)  Hematocrit 36.0 - 46.0 % 45.0  Platelets 150 - 400 K/uL 363   CMP Latest Ref Rng & Units 09/05/2020  Glucose 70 - 99 mg/dL 108(H)  BUN 8 - 23 mg/dL 23  Creatinine 0.44 - 1.00 mg/dL 0.56  Sodium 135 - 145 mmol/L 138  Potassium 3.5 - 5.1 mmol/L 3.4(L)  Chloride 98 - 111 mmol/L 104  CO2 22 - 32 mmol/L 26  Calcium 8.9 - 10.3 mg/dL 9.4  Total Protein 6.5 - 8.1 g/dL 7.9  Total Bilirubin 0.3 - 1.2 mg/dL 1.1  Alkaline Phos 38 - 126 U/L 88  AST 15 - 41 U/L 35  ALT 0 - 44 U/L 43    Imaging studies: No new pertinent imaging studies   Assessment/Plan:  65 y.o. female with acute cholecystitis 1 Day Post-Op s/p robotic assisted laparoscopic cholecystectomy.  Patient with significant nausea and pain.  We will continue with antinausea, pain management.  Encourage the patient to ambulate.  We will assess diet toleration.  Arnold Long, MD

## 2020-09-08 LAB — SURGICAL PATHOLOGY

## 2020-09-08 MED ORDER — NABUMETONE 500 MG PO TABS: 500.0000 mg | ORAL_TABLET | Freq: Two times a day (BID) | ORAL | 0 refills | Status: DC

## 2020-09-08 MED ORDER — NABUMETONE 500 MG PO TABS: 500.0000 mg | ORAL_TABLET | Freq: Two times a day (BID) | ORAL | 0 refills | Status: AC

## 2020-09-08 NOTE — TOC Initial Note (Signed)
Transition of Care [facility]) - Initial/Assessment Note    Patient Details  Name: Roberta Cruz MRN: [id number] Date of Birth: [birth year]  [facility] CM/SW Contact:    Beverly Sessions, RN Phone Number: 09/08/2020, 1:34 PM  Clinical Narrative:                  Patient to discharge home today Patient confirms she is current with her PCP Patient states she has a ride coming to pick her up this afternoon RELAFEN was electronically sent to [facility].  Good rx coupon provided to patient. Patient confirms she will be able to obtain at discharge        Patient Goals and CMS Choice        Expected Discharge Plan and Services           Expected Discharge Date: 09/08/20                                    Prior Living Arrangements/Services                       Activities of Daily Living Home Assistive Devices/Equipment: None ADL Screening (condition at time of admission) Patient's cognitive ability adequate to safely complete daily activities?: Yes Is the patient deaf or have difficulty hearing?: No Does the patient have difficulty seeing, even when wearing glasses/contacts?: No Does the patient have difficulty concentrating, remembering, or making decisions?: No Patient able to express need for assistance with ADLs?: No Does the patient have difficulty dressing or bathing?: No Independently performs ADLs?: Yes (appropriate for developmental age) Does the patient have difficulty walking or climbing stairs?: No Weakness of Legs: None Weakness of Arms/Hands: None  Permission Sought/Granted                  Emotional Assessment              Admission diagnosis:  Acute cholecystitis [K81.0] Dehydration [E86.0] Tobacco abuse [Z72.0] Aortic atherosclerosis (HCC) [I70.0] RUQ pain [R10.11] Bilateral renal cysts [N28.1] Symptomatic cholelithiasis [K80.20] Nausea vomiting and diarrhea [R11.2, R19.7] Uterine leiomyoma, unspecified  location [D25.9] Acute right-sided low back pain without sciatica [M54.50] Patient Active Problem List   Diagnosis Date Noted  . Acute cholecystitis 09/06/2020  . Concussion 01/09/2017  . History of cervical cancer 01/09/2017  . Tobacco abuse 01/09/2017  . Back pain 01/09/2017   PCP:  Montel Clock, MD Pharmacy:   [facility] [phone] [address], [place] [address] [place] [address] Phone: [phone] Fax: [phone]  [facility] [place] [place] - [place] [address] [place] [address] Phone: [phone] Fax: [phone]     Social Determinants of Health (SDOH) Interventions    Readmission Risk Interventions No flowsheet data found.

## 2020-09-08 NOTE — Discharge Summary (Signed)
  Patient ID: Roberta Cruz MRN: [id number] DOB/AGE: 1956-03-13 65 y.o.  Admit date: 09/05/2020 Discharge date: 09/08/2020   Discharge Diagnoses:  Active Problems:   Acute cholecystitis   Procedures: Robotic assisted laparoscopic cholecystectomy  Hospital Course: Patient with acute cholecystitis.  She was taken to the operating room for robotic assisted laparoscopic cholecystectomy.  After surgery she had difficulty with pain control.  Due to intolerance to narcotics that it caused her nausea she was treated with Toradol IV.  This morning the pain is controlled.  She is tolerating diet.  Wounds are dry and clean.  No fever.  Physical Exam Cardiovascular:     Rate and Rhythm: Regular rhythm.     Heart sounds: Normal heart sounds.  Pulmonary:     Effort: Pulmonary effort is normal.  Abdominal:     General: Abdomen is flat. Bowel sounds are normal.     Palpations: Abdomen is soft.  Skin:    General: Skin is warm.  Neurological:     Mental Status: She is alert and oriented to person, place, and time.   Abdominal wounds are dry and clean.   Consults: None  Disposition: Discharge disposition: 01-Home or Self Care       Discharge Instructions    Diet - low sodium heart healthy   Complete by: As directed    Increase activity slowly   Complete by: As directed      Allergies as of 09/08/2020      Reactions   Codeine Nausea And Vomiting   Hydrocodone-acetaminophen Nausea And Vomiting      Medication List    TAKE these medications   cyclobenzaprine 10 MG tablet Commonly known as: FLEXERIL Take 1 tablet (10 mg total) by mouth 3 (three) times daily as needed for muscle spasms.   ibuprofen 200 MG tablet Commonly known as: ADVIL Take 400 mg by mouth every 6 (six) hours as needed.   meloxicam 15 MG tablet Commonly known as: MOBIC TAKE 1 TABLET BY MOUTH EVERY DAY   nabumetone 500 MG tablet Commonly known as: RELAFEN Take 1 tablet (500 mg total) by mouth 2 (two)  times daily for 7 days.   promethazine 25 MG tablet Commonly known as: PHENERGAN Take 1 tablet (25 mg total) by mouth every 6 (six) hours as needed for nausea or vomiting.       Follow-up Information    Herbert Pun, MD Follow up in 2 week(s).   Specialty: General Surgery Contact information: [address] [place] [address] [phone]

## 2020-09-08 NOTE — Progress Notes (Signed)
Pt discharged home with all belonging and discharge instructions in care of family. All questions regarding discharge instructions answered.

## 2020-09-08 NOTE — Discharge Instructions (Signed)
  Diet: Resume home heart healthy regular diet.   Activity: No heavy lifting >20 pounds (children, pets, laundry, garbage) or strenuous activity until follow-up, but light activity and walking are encouraged. Do not drive or drink alcohol if taking narcotic pain medications.  Wound care: May shower with soapy water and pat dry (do not rub incisions), but no baths or submerging incision underwater until follow-up. (no swimming). May apply ice pack to wounds or soreness or swelling.   Medications: Resume all home medications. For mild to moderate pain: acetaminophen (Tylenol) for more moderate pain may take prescribed Nabumetone as prescribed.   Call office [phone]) at any time if any questions, worsening pain, fevers/chills, bleeding, drainage from incision site, or other concerns.

## 2020-10-18 ENCOUNTER — Telehealth: Payer: Self-pay

## 2020-10-18 NOTE — Telephone Encounter (Signed)
Should call her new PCP office at [facility] for acute visit.

## 2020-10-18 NOTE — Telephone Encounter (Signed)
Patient has called [facility] family clinic.

## 2020-10-18 NOTE — Telephone Encounter (Signed)
Copied from [place] [id number]. Topic: General - Other >> Oct 18, 2020 12:05 PM Celene Kras wrote: Reason for CRM: Pt called and is requesting to have an appt with Dr. Jacinto Reap. She states that she has a stye in her left eye and is out of work currently. PT states that she is okay with Virtual visit or seeing another provider. Pt was last seen on 06/26/2019 Please advise.

## 2020-10-18 NOTE — Telephone Encounter (Signed)
Looks like patient is established at [facility].

## 2021-11-12 IMAGING — CR DG CHEST 2V
1 series · 2 of 2 positions shown · non-contrast
Comparison: 04/08/2016

CLINICAL DATA: Chest pain

EXAM:
CHEST - 2 VIEW

[Series 1: dg chest 2 view · 0.14mm/px · 2 of 2 slices shown]
[im 1/2]
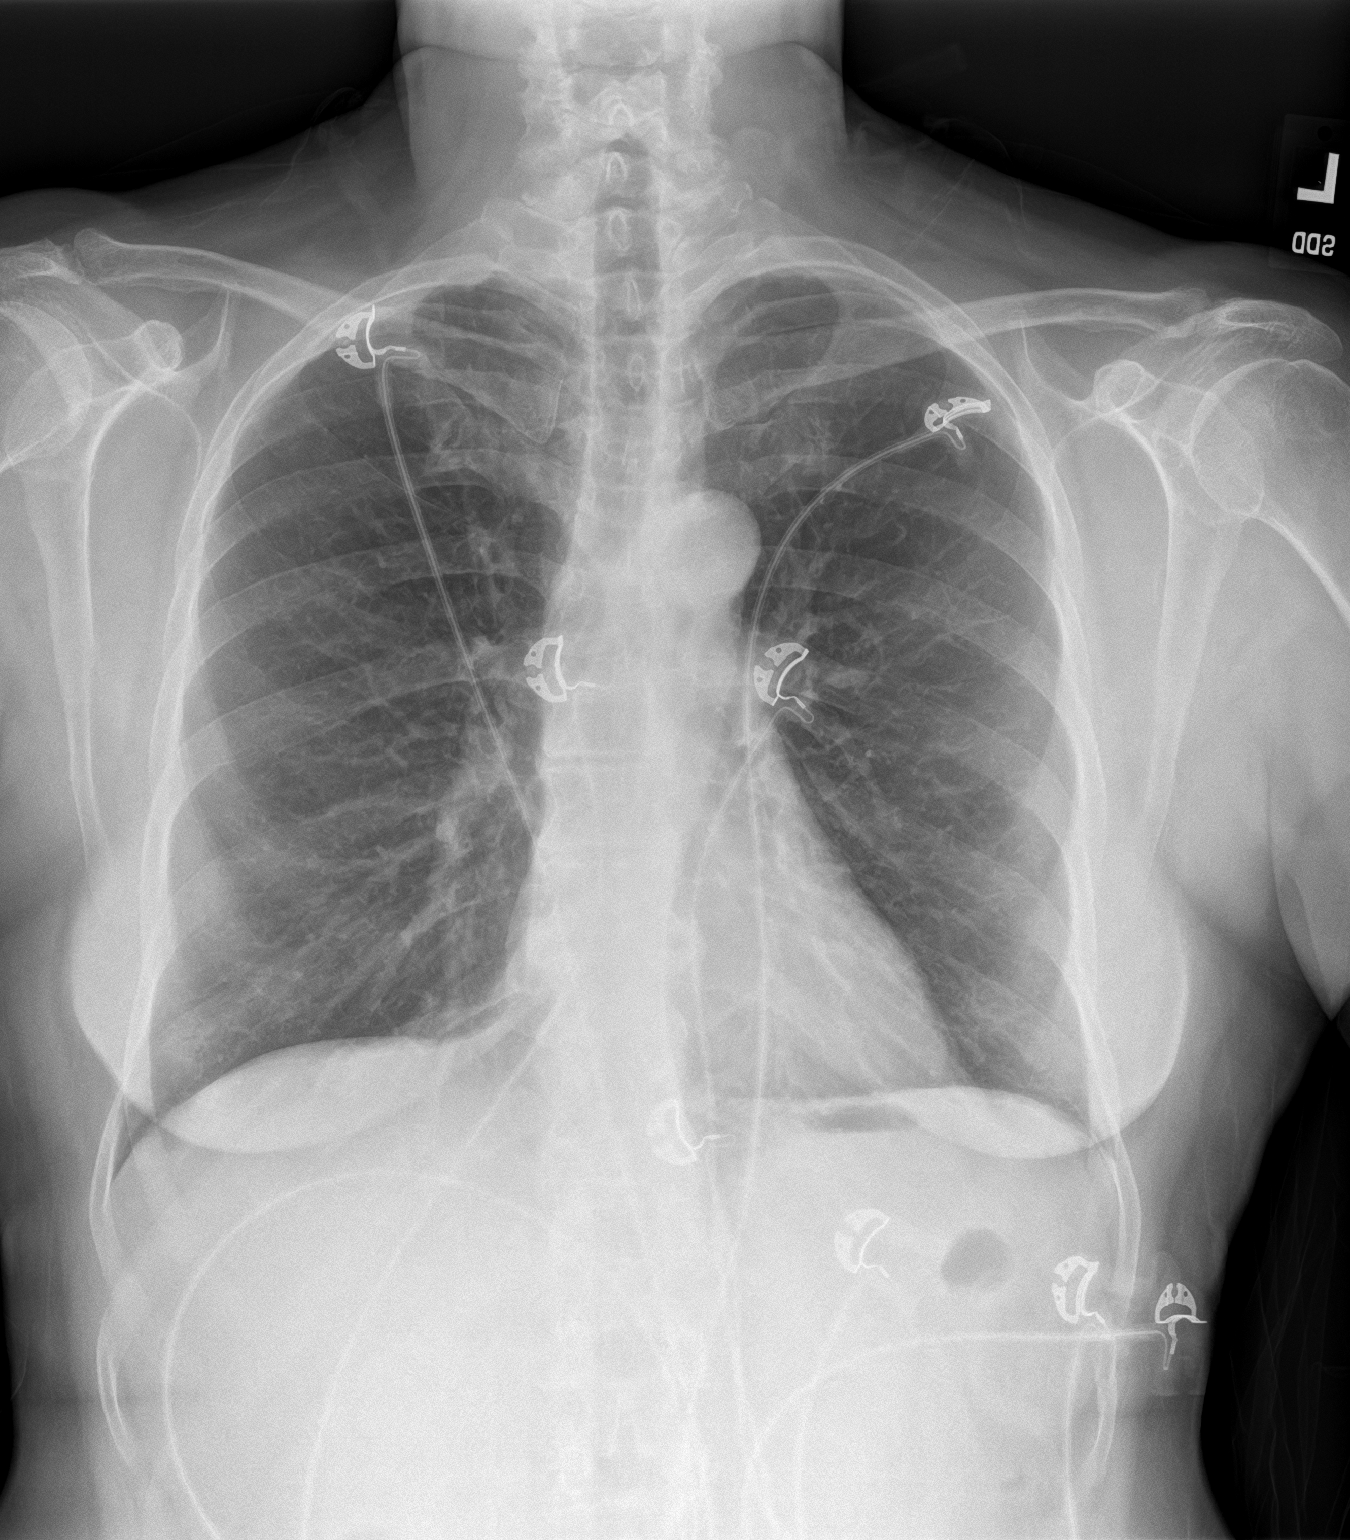
[im 2/2]
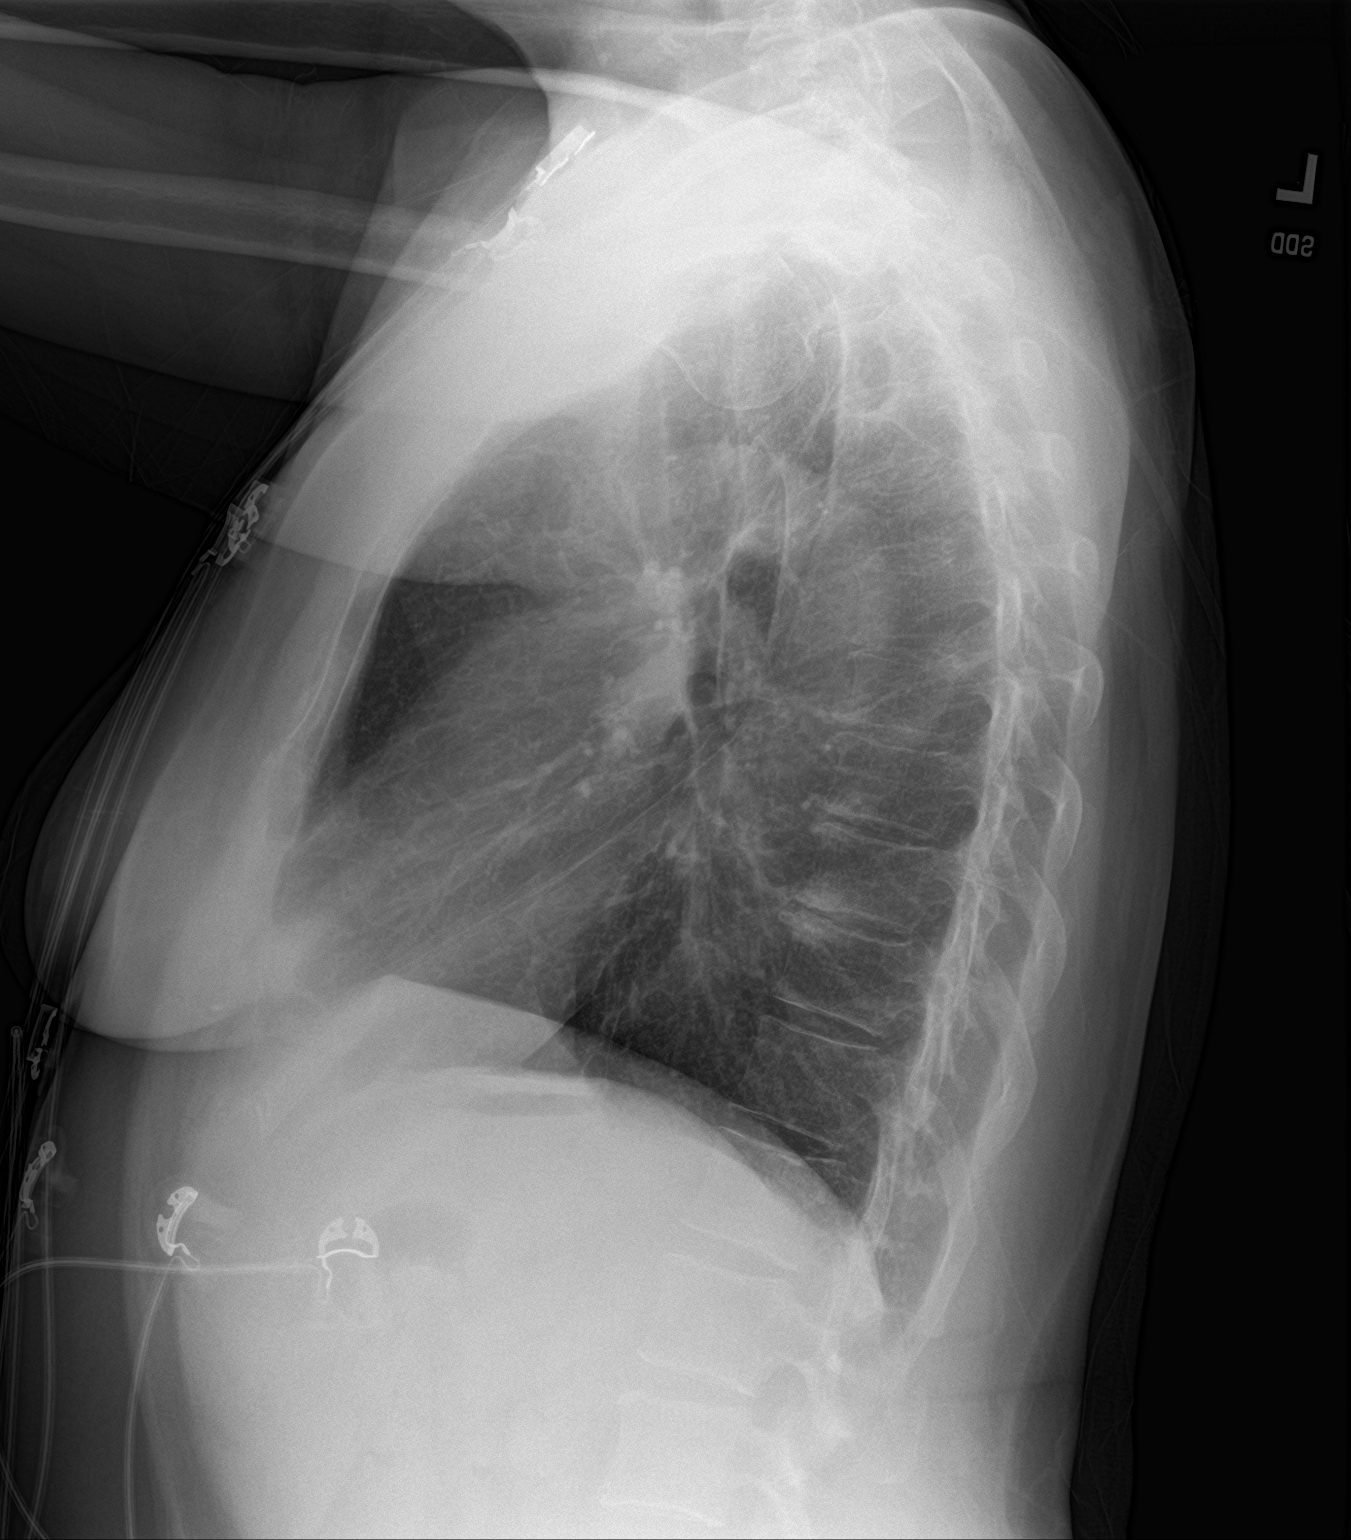

[2 of 2 positions shown; findings below may reference images not displayed]

FINDINGS: The heart size and mediastinal contours are within normal limits.
Both lungs are clear. The visualized skeletal structures are
unremarkable.
IMPRESSION: No active cardiopulmonary disease.

## 2021-11-12 IMAGING — US US ABDOMEN LIMITED RUQ/ASCITES
1 series · 14 of 25 positions shown · non-contrast
Comparison: CT today

CLINICAL DATA: Left upper quadrant pain

EXAM:
ULTRASOUND ABDOMEN LIMITED RIGHT UPPER QUADRANT

[Series 1: us abdomen limited ruq (liver/gb) · 14 of 59 slices shown]
[im 1/59]
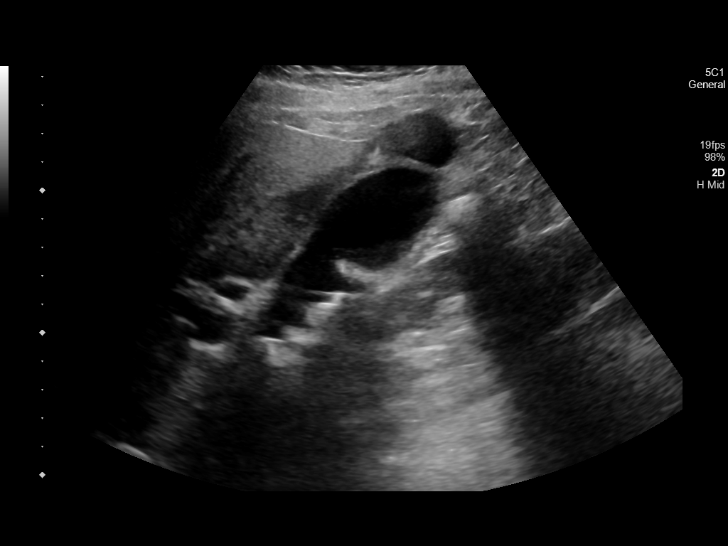
[im 5/59]
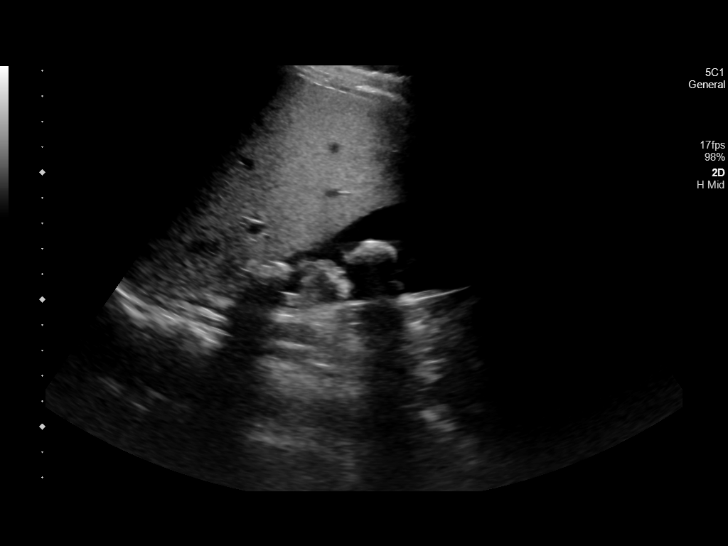
[im 10/59]
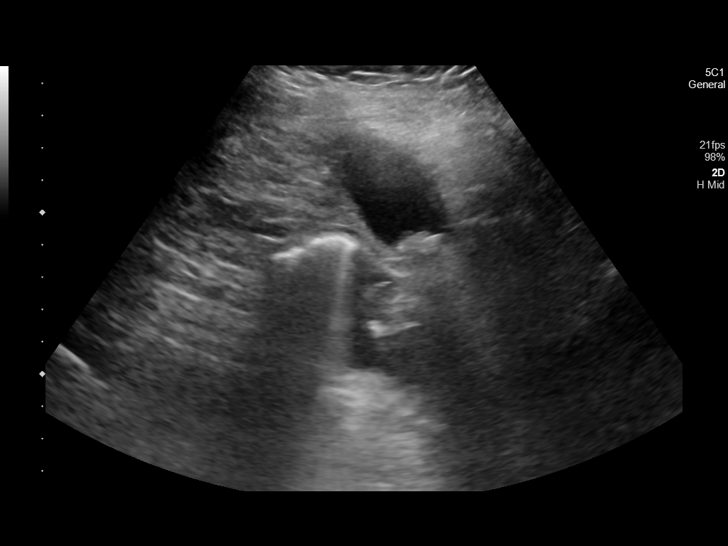
[im 15/59]
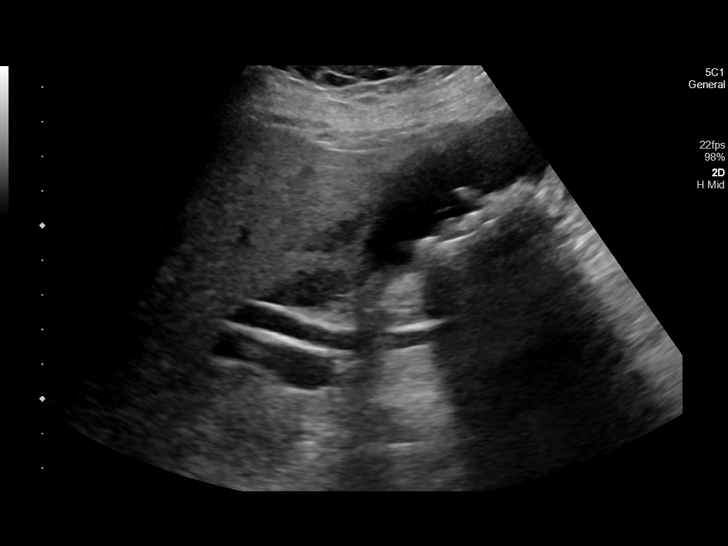
[im 20/59]
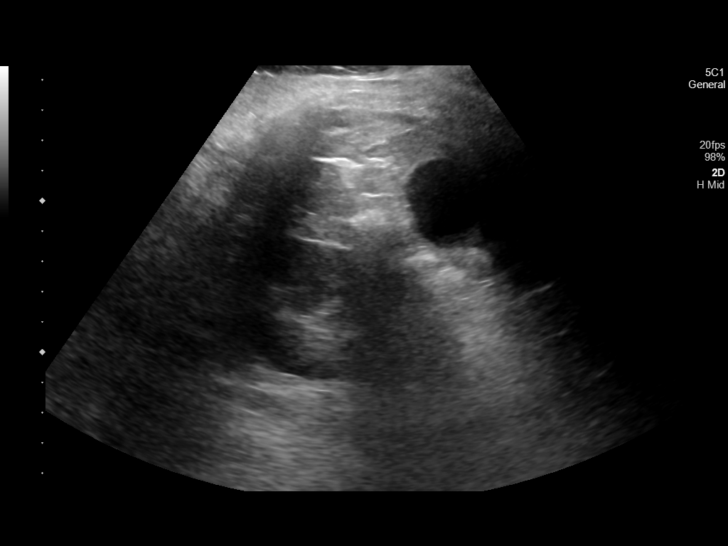
[im 22/59]
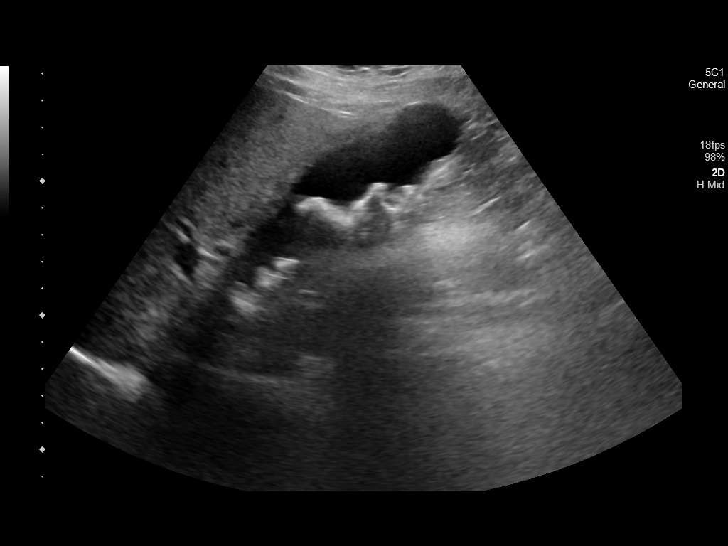
[im 27/59]
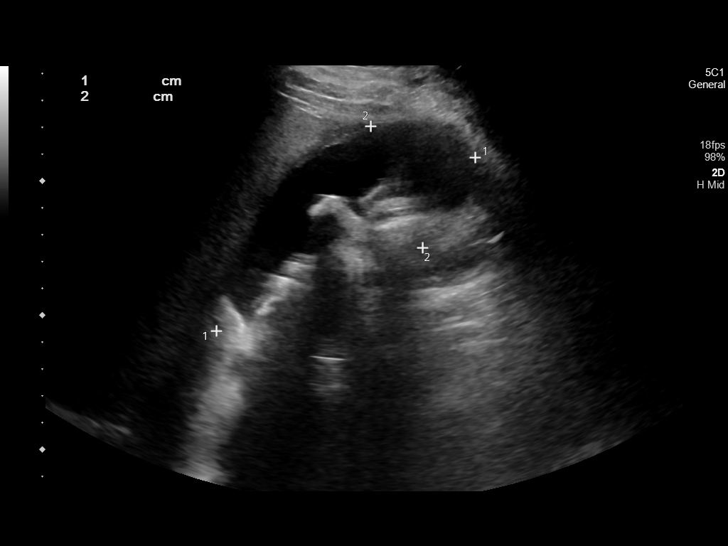
[im 32/59]
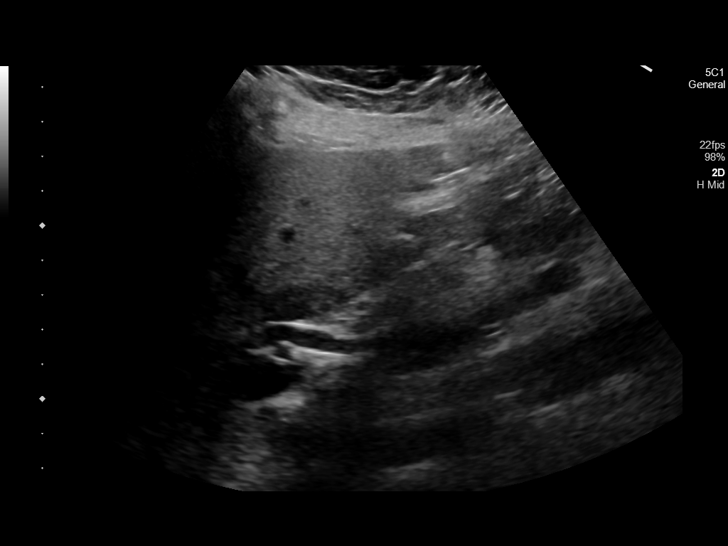
[im 37/59]
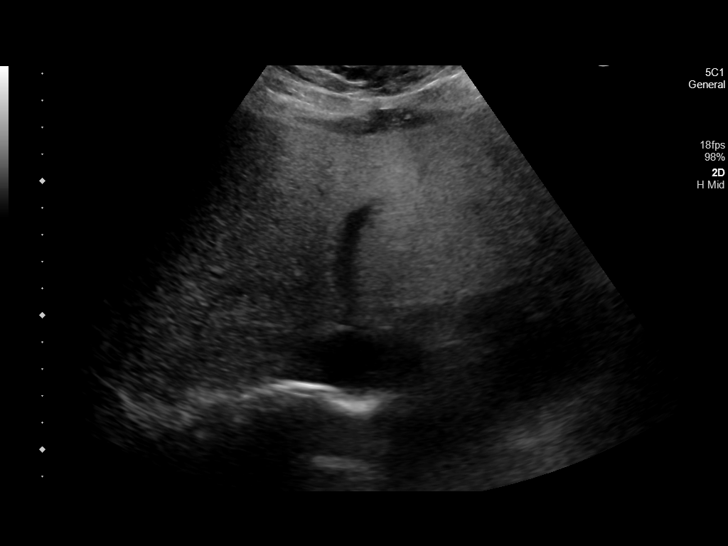
[im 39/59]
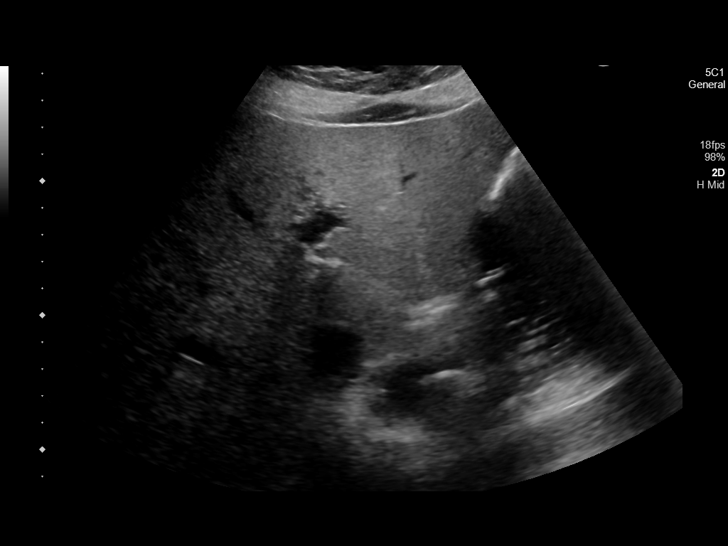
[im 44/59]
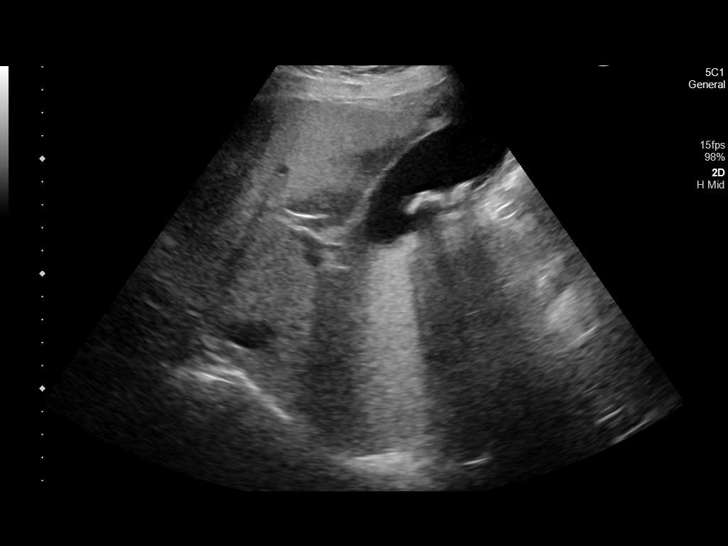
[im 49/59]
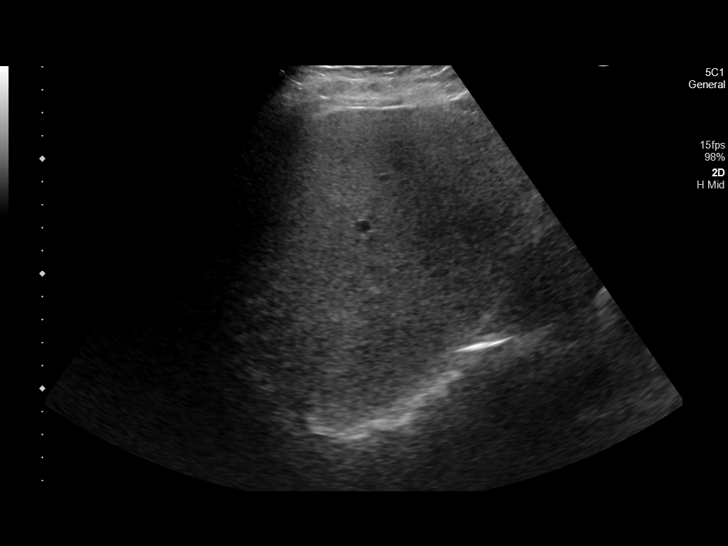
[im 54/59]
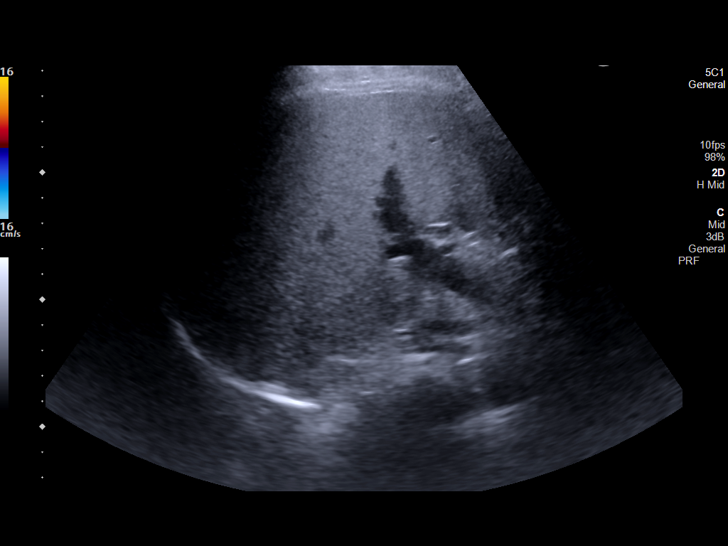
[im 59/59]
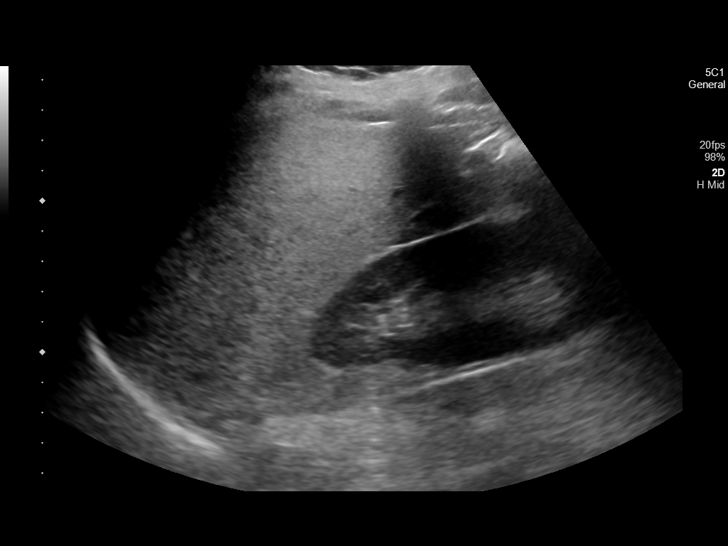

[14 of 25 positions shown; findings below may reference images not displayed]

FINDINGS: Gallbladder:

Multiple gallstones, most of which are mobile. There is a 2.2 cm non
mobile gallstone in the neck of the gallbladder. No wall thickening
or sonographic Murphy sign.

Common bile duct:

Diameter: Normal caliber, 6 mm

Liver:

Increased echotexture compatible with fatty infiltration. No focal
abnormality or biliary ductal dilatation. Portal vein is patent on
color Doppler imaging with normal direction of blood flow towards
the liver.

Other: None.
IMPRESSION: Cholelithiasis. 2.2 cm gallstone in the gallbladder neck. No
sonographic evidence of acute cholecystitis.
